# Patient Record
Sex: Female | Born: 1970 | Race: Black or African American | Hispanic: No | Marital: Single | Smoking: Current every day smoker
Health system: Southern US, Community
[De-identification: ages and names within clinical notes are randomized; demographics above are authoritative.]

## PROBLEM LIST (undated history)

## (undated) DIAGNOSIS — D649 Anemia, unspecified: Secondary | ICD-10-CM

## (undated) DIAGNOSIS — E119 Type 2 diabetes mellitus without complications: Secondary | ICD-10-CM

## (undated) DIAGNOSIS — E669 Obesity, unspecified: Secondary | ICD-10-CM

## (undated) DIAGNOSIS — I10 Essential (primary) hypertension: Secondary | ICD-10-CM

## (undated) DIAGNOSIS — G4730 Sleep apnea, unspecified: Secondary | ICD-10-CM

## (undated) HISTORY — PX: TUBAL LIGATION: SHX77

## (undated) HISTORY — PX: MULTIPLE TOOTH EXTRACTIONS: SHX2053

---

## 1997-07-27 ENCOUNTER — Other Ambulatory Visit: Admission: RE | Admit: 1997-07-27 | Discharge: 1997-07-27 | Payer: Self-pay | Admitting: Obstetrics

## 1997-08-24 ENCOUNTER — Inpatient Hospital Stay (HOSPITAL_COMMUNITY): Admission: AD | Admit: 1997-08-24 | Discharge: 1997-08-26 | Payer: Self-pay | Admitting: Obstetrics

## 1998-05-24 ENCOUNTER — Emergency Department (HOSPITAL_COMMUNITY): Admission: EM | Admit: 1998-05-24 | Discharge: 1998-05-24 | Payer: Self-pay | Admitting: Emergency Medicine

## 1998-08-23 ENCOUNTER — Other Ambulatory Visit: Admission: RE | Admit: 1998-08-23 | Discharge: 1998-08-23 | Payer: Self-pay | Admitting: Obstetrics

## 1998-10-04 ENCOUNTER — Inpatient Hospital Stay (HOSPITAL_COMMUNITY): Admission: AD | Admit: 1998-10-04 | Discharge: 1998-10-04 | Payer: Self-pay | Admitting: Obstetrics

## 1999-04-02 ENCOUNTER — Inpatient Hospital Stay (HOSPITAL_COMMUNITY): Admission: AD | Admit: 1999-04-02 | Discharge: 1999-04-02 | Payer: Self-pay | Admitting: Obstetrics

## 1999-04-10 ENCOUNTER — Inpatient Hospital Stay (HOSPITAL_COMMUNITY): Admission: AD | Admit: 1999-04-10 | Discharge: 1999-04-14 | Payer: Self-pay | Admitting: Obstetrics

## 1999-04-28 ENCOUNTER — Observation Stay (HOSPITAL_COMMUNITY): Admission: AD | Admit: 1999-04-28 | Discharge: 1999-04-29 | Payer: Self-pay | Admitting: Obstetrics

## 2002-01-16 ENCOUNTER — Encounter: Payer: Self-pay | Admitting: Emergency Medicine

## 2002-01-16 ENCOUNTER — Emergency Department (HOSPITAL_COMMUNITY): Admission: EM | Admit: 2002-01-16 | Discharge: 2002-01-16 | Payer: Self-pay | Admitting: Emergency Medicine

## 2003-01-20 ENCOUNTER — Emergency Department (HOSPITAL_COMMUNITY): Admission: EM | Admit: 2003-01-20 | Discharge: 2003-01-20 | Payer: Self-pay

## 2003-04-29 ENCOUNTER — Encounter: Admission: RE | Admit: 2003-04-29 | Discharge: 2003-04-29 | Payer: Self-pay | Admitting: Family Medicine

## 2004-06-19 ENCOUNTER — Emergency Department (HOSPITAL_COMMUNITY): Admission: EM | Admit: 2004-06-19 | Discharge: 2004-06-19 | Payer: Self-pay | Admitting: Emergency Medicine

## 2005-05-31 ENCOUNTER — Inpatient Hospital Stay (HOSPITAL_COMMUNITY): Admission: AD | Admit: 2005-05-31 | Discharge: 2005-05-31 | Payer: Self-pay | Admitting: Family Medicine

## 2005-10-13 ENCOUNTER — Emergency Department (HOSPITAL_COMMUNITY): Admission: EM | Admit: 2005-10-13 | Discharge: 2005-10-13 | Payer: Self-pay | Admitting: Family Medicine

## 2007-10-15 ENCOUNTER — Inpatient Hospital Stay (HOSPITAL_COMMUNITY): Admission: AD | Admit: 2007-10-15 | Discharge: 2007-10-15 | Payer: Self-pay | Admitting: Obstetrics & Gynecology

## 2007-10-22 ENCOUNTER — Ambulatory Visit: Payer: Self-pay | Admitting: Obstetrics & Gynecology

## 2007-11-12 ENCOUNTER — Ambulatory Visit: Payer: Self-pay | Admitting: Obstetrics & Gynecology

## 2008-09-05 ENCOUNTER — Emergency Department (HOSPITAL_COMMUNITY): Admission: EM | Admit: 2008-09-05 | Discharge: 2008-09-05 | Payer: Self-pay | Admitting: Family Medicine

## 2008-09-20 ENCOUNTER — Emergency Department (HOSPITAL_COMMUNITY): Admission: EM | Admit: 2008-09-20 | Discharge: 2008-09-20 | Payer: Self-pay | Admitting: Emergency Medicine

## 2008-11-25 ENCOUNTER — Emergency Department (HOSPITAL_COMMUNITY): Admission: EM | Admit: 2008-11-25 | Discharge: 2008-11-25 | Payer: Self-pay | Admitting: Emergency Medicine

## 2008-12-17 ENCOUNTER — Emergency Department (HOSPITAL_COMMUNITY): Admission: EM | Admit: 2008-12-17 | Discharge: 2008-12-17 | Payer: Self-pay | Admitting: Emergency Medicine

## 2010-04-01 ENCOUNTER — Encounter: Payer: Self-pay | Admitting: Family Medicine

## 2010-06-01 ENCOUNTER — Ambulatory Visit (INDEPENDENT_AMBULATORY_CARE_PROVIDER_SITE_OTHER): Payer: Self-pay

## 2010-06-01 ENCOUNTER — Inpatient Hospital Stay (INDEPENDENT_AMBULATORY_CARE_PROVIDER_SITE_OTHER)
Admission: RE | Admit: 2010-06-01 | Discharge: 2010-06-01 | Disposition: A | Discharge status: Home / Self Care | Payer: Self-pay | Source: Ambulatory Visit | Attending: Family Medicine | Admitting: Family Medicine

## 2010-06-01 DIAGNOSIS — IMO0002 Reserved for concepts with insufficient information to code with codable children: Secondary | ICD-10-CM

## 2010-06-01 DIAGNOSIS — S93409A Sprain of unspecified ligament of unspecified ankle, initial encounter: Secondary | ICD-10-CM

## 2010-06-16 LAB — POCT I-STAT, CHEM 8
BUN: 8 mg/dL (ref 6–23)
Creatinine, Ser: 0.8 mg/dL (ref 0.4–1.2)
HCT: 45 % (ref 36.0–46.0)
Hemoglobin: 15.3 g/dL — ABNORMAL HIGH (ref 12.0–15.0)
Sodium: 139 mEq/L (ref 135–145)

## 2010-06-16 LAB — POCT PREGNANCY, URINE: Preg Test, Ur: NEGATIVE

## 2010-06-18 LAB — URINALYSIS, ROUTINE W REFLEX MICROSCOPIC
Bilirubin Urine: NEGATIVE
Glucose, UA: NEGATIVE mg/dL
Ketones, ur: NEGATIVE mg/dL
pH: 7 (ref 5.0–8.0)

## 2010-06-18 LAB — URINE MICROSCOPIC-ADD ON

## 2010-06-18 LAB — POCT PREGNANCY, URINE: Preg Test, Ur: NEGATIVE

## 2010-06-19 LAB — CULTURE, ROUTINE-ABSCESS

## 2010-08-20 ENCOUNTER — Ambulatory Visit (INDEPENDENT_AMBULATORY_CARE_PROVIDER_SITE_OTHER): Payer: Self-pay

## 2010-08-20 ENCOUNTER — Inpatient Hospital Stay (INDEPENDENT_AMBULATORY_CARE_PROVIDER_SITE_OTHER)
Admission: RE | Admit: 2010-08-20 | Discharge: 2010-08-20 | Disposition: A | Discharge status: Home / Self Care | Payer: Self-pay | Source: Ambulatory Visit | Attending: Family Medicine | Admitting: Family Medicine

## 2010-08-20 DIAGNOSIS — R071 Chest pain on breathing: Secondary | ICD-10-CM

## 2010-08-20 DIAGNOSIS — M79609 Pain in unspecified limb: Secondary | ICD-10-CM

## 2010-12-08 LAB — CBC
HCT: 25.4 — ABNORMAL LOW
RBC: 3.73 — ABNORMAL LOW
WBC: 5.8

## 2010-12-08 LAB — SAMPLE TO BLOOD BANK

## 2010-12-08 LAB — WET PREP, GENITAL: Trich, Wet Prep: NONE SEEN

## 2010-12-08 LAB — GC/CHLAMYDIA PROBE AMP, GENITAL: GC Probe Amp, Genital: NEGATIVE

## 2012-03-20 ENCOUNTER — Emergency Department (HOSPITAL_COMMUNITY)
Admission: EM | Admit: 2012-03-20 | Discharge: 2012-03-20 | Disposition: A | Discharge status: Home Health | Payer: Self-pay | Attending: Emergency Medicine | Admitting: Emergency Medicine

## 2012-03-20 ENCOUNTER — Encounter (HOSPITAL_COMMUNITY): Payer: Self-pay | Admitting: *Deleted

## 2012-03-20 DIAGNOSIS — X500XXA Overexertion from strenuous movement or load, initial encounter: Secondary | ICD-10-CM | POA: Insufficient documentation

## 2012-03-20 DIAGNOSIS — H538 Other visual disturbances: Secondary | ICD-10-CM | POA: Insufficient documentation

## 2012-03-20 DIAGNOSIS — I10 Essential (primary) hypertension: Secondary | ICD-10-CM | POA: Insufficient documentation

## 2012-03-20 DIAGNOSIS — IMO0002 Reserved for concepts with insufficient information to code with codable children: Secondary | ICD-10-CM | POA: Insufficient documentation

## 2012-03-20 DIAGNOSIS — F17200 Nicotine dependence, unspecified, uncomplicated: Secondary | ICD-10-CM | POA: Insufficient documentation

## 2012-03-20 DIAGNOSIS — Z79899 Other long term (current) drug therapy: Secondary | ICD-10-CM | POA: Insufficient documentation

## 2012-03-20 DIAGNOSIS — Y93F2 Activity, caregiving, lifting: Secondary | ICD-10-CM | POA: Insufficient documentation

## 2012-03-20 DIAGNOSIS — Y9210 Unspecified residential institution as the place of occurrence of the external cause: Secondary | ICD-10-CM | POA: Insufficient documentation

## 2012-03-20 DIAGNOSIS — S46912A Strain of unspecified muscle, fascia and tendon at shoulder and upper arm level, left arm, initial encounter: Secondary | ICD-10-CM

## 2012-03-20 DIAGNOSIS — R42 Dizziness and giddiness: Secondary | ICD-10-CM | POA: Insufficient documentation

## 2012-03-20 DIAGNOSIS — X503XXA Overexertion from repetitive movements, initial encounter: Secondary | ICD-10-CM | POA: Insufficient documentation

## 2012-03-20 HISTORY — DX: Essential (primary) hypertension: I10

## 2012-03-20 LAB — POCT I-STAT, CHEM 8
Calcium, Ion: 1.12 mmol/L (ref 1.12–1.23)
Chloride: 108 mEq/L (ref 96–112)
HCT: 40 % (ref 36.0–46.0)
Sodium: 143 mEq/L (ref 135–145)

## 2012-03-20 MED ORDER — NAPROXEN 500 MG PO TABS: 500.0000 mg | ORAL_TABLET | Freq: Two times a day (BID) | ORAL | Status: DC

## 2012-03-20 MED ORDER — LISINOPRIL 10 MG PO TABS: 10.0000 mg | ORAL_TABLET | Freq: Every day | ORAL | Status: DC

## 2012-03-20 MED ORDER — LISINOPRIL 10 MG PO TABS
10.0000 mg | ORAL_TABLET | Freq: Once | ORAL | Status: AC
Start: 2012-03-20 — End: 2012-03-20
  Administered 2012-03-20: 10 mg via ORAL
  Filled 2012-03-20: qty 1

## 2012-03-20 MED ORDER — NAPROXEN 250 MG PO TABS
500.0000 mg | ORAL_TABLET | Freq: Once | ORAL | Status: AC
  Administered 2012-03-20: 500 mg via ORAL
  Filled 2012-03-20: qty 2

## 2012-03-20 NOTE — ED Notes (Signed)
Patient states headache and dizziness with blurred vision x 2 days, patient states she feels blood pressure has been running high

## 2012-03-20 NOTE — ED Provider Notes (Signed)
History     CSN: [id number]  Arrival date & time 03/20/12  0317   First MD Initiated Contact with Patient 03/20/12 [phone]      Chief Complaint  Patient presents with  . Headache  . Dizziness    (Consider location/radiation/quality/duration/timing/severity/associated sxs/prior treatment) HPI Comments: The patient is a 42 year old female with a known history of hypertension who is currently unmedicated. She presents with 2 weeks of blurred vision, 2 days of neck pain and left shoulder pain.   She states that 2 weeks ago she noticed that her vision was going to mildly blurry. It is binocular vision, nothing seems to make it better or worse and it is constant. She does not lose her vision, she does not see floaters or bright lights and has no pain in her eyes. She does have a history of requiring corrective lenses but has not had evaluation by an eye specialist in many years.  She also complains of a headache which she stated came on tonight, has mild, diffuse, throbbing.  She also complains of bilateral neck pain and left shoulder pain which is worse when she turns her head from side to side and left upper left arm. 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 She lifts heavy objects and people daily and states that this has been making her pain worse. She denies any acute injuries and denies fevers, chills, nausea, vomiting or any other complaints.  When the patient started having increased left shoulder pain this evening with moving her arm she decided to take her blood pressure and found her systolic blood pressure to be 528 systolic over 110 diastolic.  Patient is a 42 y.o. female presenting with headaches. The history is provided by the patient.  Headache     Past Medical History  Diagnosis Date  . Hypertension     Past Surgical History  Procedure Date  . Cesarean section     No family history on file.  History  Substance Use Topics  . Smoking  status: Current Every Day Smoker  . Smokeless tobacco: Not on file  . Alcohol Use: No    OB History    Grav Para Term Preterm Abortions TAB SAB Ect Mult Living                  Review of Systems  Neurological: Positive for headaches.  All other systems reviewed and are negative.    Allergies  Review of patient's allergies indicates no known allergies.  Home Medications   Current Outpatient Rx  Name  Route  Sig  Dispense  Refill  . LISINOPRIL 10 MG PO TABS   Oral   Take 1 tablet (10 mg total) by mouth daily.   30 tablet   1   . NAPROXEN 500 MG PO TABS   Oral   Take 1 tablet (500 mg total) by mouth 2 (two) times daily with a meal.   30 tablet   0     BP 166/97  Temp 98.1 F (36.7 C) (Oral)  Resp 20  SpO2 98%  LMP 02/24/2012  Physical Exam  Nursing note and vitals reviewed. Constitutional: She appears well-developed and well-nourished. No distress.  HENT:  Head: Normocephalic and atraumatic.  Mouth/Throat: Oropharynx is clear and moist. No oropharyngeal exudate.       Tympanic membranes are clear bilaterally  Eyes: Conjunctivae normal and EOM are normal. Pupils are equal, round, and reactive to light. Right eye exhibits  no discharge. Left eye exhibits no discharge. No scleral icterus.       Pupils are equal round and reactive, her extraocular movements are intact, the optic discs are visualized bilaterally, no obvious papilledema  Neck: Normal range of motion. Neck supple. No JVD present. No thyromegaly present.       Though the patient is resistant to move her neck from side to side during the history, on exam when I look in her ears she is able to fully move her head from side to side to facilitate this exam.  Cardiovascular: Normal rate, regular rhythm, normal heart sounds and intact distal pulses.  Exam reveals no gallop and no friction rub.   No murmur heard. Pulmonary/Chest: Effort normal and breath sounds normal. No respiratory distress. She has no  wheezes. She has no rales.  Abdominal: Soft. Bowel sounds are normal. She exhibits no distension and no mass. There is no tenderness.  Musculoskeletal: Normal range of motion. She exhibits tenderness ( Reproducible tenderness to palpation in the bilateral trapezius and strap muscles of the neck, left shoulder and proximal arm tenderness on the left.). She exhibits no edema.  Lymphadenopathy:    She has no cervical adenopathy.  Neurological: She is alert. Coordination normal.       Gait is normal, speech is clear, cranial nerves III through XII are intact  Skin: Skin is warm and dry. No rash noted. No erythema.  Psychiatric: She has a normal mood and affect. Her behavior is normal.    ED Course  Procedures (including critical care time)   Labs Reviewed  POCT I-STAT, CHEM 8   No results found.   1. Hypertension   2. Strain of shoulder, left       MDM  The patient has hypertension, see vital signs below. She will get visual acuity screening, chemistry testing for potassium and renal function prior to starting therapy with antihypertensives. Will also get EKG for cardiac evaluation in the setting of hypertension and shoulder pain. I suspect that the pain she is having in her shoulders and her neck is related to musculoskeletal type pain and not related to cardiac type pain.  ED ECG REPORT  I personally interpreted this EKG   Date: 03/20/2012   Rate: 84  Rhythm: normal sinus rhythm  QRS Axis: normal  Intervals: normal  ST/T Wave abnormalities: nonspecific T wave changes  Conduction Disutrbances:none  Narrative Interpretation:   Old EKG Reviewed: none available  istat chem normal, Visual acuity normal - pt can f/u with optho as outpt, BP meds started, pt stable for dlc.           Vida Roller, MD 03/20/12 [phone]

## 2012-11-11 ENCOUNTER — Emergency Department (HOSPITAL_COMMUNITY)
Admission: EM | Admit: 2012-11-11 | Discharge: 2012-11-11 | Disposition: A | Discharge status: Home / Self Care | Payer: PRIVATE HEALTH INSURANCE | Attending: Emergency Medicine | Admitting: Emergency Medicine

## 2012-11-11 ENCOUNTER — Encounter (HOSPITAL_COMMUNITY): Payer: Self-pay | Admitting: *Deleted

## 2012-11-11 DIAGNOSIS — M7989 Other specified soft tissue disorders: Secondary | ICD-10-CM | POA: Insufficient documentation

## 2012-11-11 DIAGNOSIS — M79609 Pain in unspecified limb: Secondary | ICD-10-CM

## 2012-11-11 DIAGNOSIS — M79605 Pain in left leg: Secondary | ICD-10-CM

## 2012-11-11 DIAGNOSIS — F17200 Nicotine dependence, unspecified, uncomplicated: Secondary | ICD-10-CM | POA: Insufficient documentation

## 2012-11-11 DIAGNOSIS — I10 Essential (primary) hypertension: Secondary | ICD-10-CM | POA: Insufficient documentation

## 2012-11-11 DIAGNOSIS — E669 Obesity, unspecified: Secondary | ICD-10-CM | POA: Insufficient documentation

## 2012-11-11 DIAGNOSIS — Z79899 Other long term (current) drug therapy: Secondary | ICD-10-CM | POA: Insufficient documentation

## 2012-11-11 HISTORY — DX: Obesity, unspecified: E66.9

## 2012-11-11 MED ORDER — HYDROCODONE-ACETAMINOPHEN 5-325 MG PO TABS: 1.0000 | ORAL_TABLET | ORAL | Status: DC | PRN

## 2012-11-11 MED ORDER — IBUPROFEN 800 MG PO TABS: 800.0000 mg | ORAL_TABLET | Freq: Three times a day (TID) | ORAL | Status: DC | PRN

## 2012-11-11 MED ORDER — IBUPROFEN 400 MG PO TABS
800.0000 mg | ORAL_TABLET | Freq: Once | ORAL | Status: AC
  Administered 2012-11-11: 800 mg via ORAL
  Filled 2012-11-11: qty 2

## 2012-11-11 NOTE — ED Notes (Signed)
Pt reports pain to left leg x 3 weeks, started in left heel and now radiates all the way up her leg. Denies any swelling or redness, denies injury to leg. Ambulatory at triage.

## 2012-11-11 NOTE — ED Provider Notes (Signed)
CSN: [id number]     Arrival date & time 11/11/12  1224 History   First MD Initiated Contact with Patient 11/11/12 1320     Chief Complaint  Patient presents with  . Leg Pain   (Consider location/radiation/quality/duration/timing/severity/associated sxs/prior Treatment) HPI Comments: Patient reports has had left leg pain for the past 3 weeks. States that the pain extends from her left calf into her left thigh. Denies any injury to the area. Does admit to swelling of her lower leg denies fever, chills, chest pain, shortness of breath. Patient denies any immobilization recently, has exogenous estrogen, has never had a blood clot. The patient has a grandmother who had a history of blood clots. Patient is overweight and smokes.   Patient is a 42 y.o. female presenting with leg pain. The history is provided by the patient.  Leg Pain Associated symptoms: no fever     Past Medical History  Diagnosis Date  . Hypertension   . Obesity    Past Surgical History  Procedure Laterality Date  . Cesarean section     History reviewed. No pertinent family history. History  Substance Use Topics  . Smoking status: Current Every Day Smoker  . Smokeless tobacco: Not on file  . Alcohol Use: No   OB History   Grav Para Term Preterm Abortions TAB SAB Ect Mult Living                 Review of Systems  Constitutional: Negative for fever and chills.  Respiratory: Negative for shortness of breath.   Cardiovascular: Positive for leg swelling. Negative for chest pain.  Skin: Negative for color change.  Neurological: Negative for weakness and numbness.    Allergies  Review of patient's allergies indicates no known allergies.  Home Medications   Current Outpatient Rx  Name  Route  Sig  Dispense  Refill  . lisinopril (PRINIVIL,ZESTRIL) 10 MG tablet   Oral   Take 1 tablet (10 mg total) by mouth daily.   30 tablet   1   . OVER THE COUNTER MEDICATION   Oral   Take 1 tablet by mouth daily.           BP 187/100  Pulse 99  Temp(Src) 98.8 F (37.1 C) (Oral)  Resp 20  SpO2 97%  LMP 10/21/2012 Physical Exam  Nursing note and vitals reviewed. Constitutional: She appears well-developed and well-nourished. No distress.  HENT:  Head: Normocephalic and atraumatic.  Neck: Neck supple.  Pulmonary/Chest: Effort normal.  Musculoskeletal:  Left calf swelling and tenderness.  No erythema or warmth.  Distal pulses intact.    Neurological: She is alert.  Skin: She is not diaphoretic.    ED Course  Procedures (including critical care time) Labs Review Labs Reviewed - No data to display Imaging Review No results found.  MDM   1. Left leg swelling   2. Left leg pain    Patient with left calf and left leg pain for 3 weeks without injury. There is no erythema, warmth, fevers to suggest cellulitis. Venous doppler ultrasound ordered given calf tenderness and swelling. Doppler is negative for blood clot.  Pt does have swelling in this leg, possible lymphedema but for unknown reason - pt has never had trauma or surgery that would cause this.  D/C home with pain medication and PCP follow up.  Discussed result, findings, treatment, and follow up  with patient.  Pt given return precautions.  Pt verbalizes understanding and agrees with plan.  Trixie Dredge, PA-C 11/11/12 1610

## 2012-11-11 NOTE — Progress Notes (Signed)
*  PRELIMINARY RESULTS* Vascular Ultrasound Left lower extremity venous duplex has been completed.  Preliminary findings: no obvious evidence of DVT or baker's cyst.   Farrel Demark, RDMS, RVT  11/11/2012, 2:32 PM

## 2012-11-12 NOTE — ED Provider Notes (Signed)
Medical screening examination/treatment/procedure(s) were performed by non-physician practitioner and as supervising physician I was immediately available for consultation/collaboration.    Gilda Crease, MD 11/12/12 1350

## 2013-03-24 ENCOUNTER — Emergency Department (INDEPENDENT_AMBULATORY_CARE_PROVIDER_SITE_OTHER)
Admission: EM | Admit: 2013-03-24 | Discharge: 2013-03-24 | Disposition: A | Discharge status: Home / Self Care | Payer: PRIVATE HEALTH INSURANCE | Source: Home / Self Care | Attending: Family Medicine | Admitting: Family Medicine

## 2013-03-24 ENCOUNTER — Encounter (HOSPITAL_COMMUNITY): Payer: Self-pay | Admitting: Emergency Medicine

## 2013-03-24 DIAGNOSIS — M549 Dorsalgia, unspecified: Secondary | ICD-10-CM

## 2013-03-24 LAB — POCT URINALYSIS DIP (DEVICE)
Bilirubin Urine: NEGATIVE
Glucose, UA: NEGATIVE mg/dL
KETONES UR: NEGATIVE mg/dL
LEUKOCYTES UA: NEGATIVE
Nitrite: NEGATIVE
PH: 6.5 (ref 5.0–8.0)
PROTEIN: 30 mg/dL — AB
Specific Gravity, Urine: 1.025 (ref 1.005–1.030)
Urobilinogen, UA: 0.2 mg/dL (ref 0.0–1.0)

## 2013-03-24 LAB — POCT PREGNANCY, URINE: Preg Test, Ur: NEGATIVE

## 2013-03-24 MED ORDER — KETOROLAC TROMETHAMINE 60 MG/2ML IM SOLN
60.0000 mg | Freq: Once | INTRAMUSCULAR | Status: AC
  Administered 2013-03-24: 60 mg via INTRAMUSCULAR

## 2013-03-24 MED ORDER — KETOROLAC TROMETHAMINE 60 MG/2ML IM SOLN
INTRAMUSCULAR | Status: AC
  Filled 2013-03-24: qty 2

## 2013-03-24 MED ORDER — HYDROCODONE-ACETAMINOPHEN 5-325 MG PO TABS: 1.0000 | ORAL_TABLET | ORAL | Status: DC | PRN

## 2013-03-24 MED ORDER — NAPROXEN 500 MG PO TABS: 500.0000 mg | ORAL_TABLET | Freq: Two times a day (BID) | ORAL | Status: DC

## 2013-03-24 NOTE — ED Provider Notes (Signed)
CSN: [id number]     Arrival date & time 03/24/13  1339 History   First MD Initiated Contact with Patient 03/24/13 1547     Chief Complaint  Patient presents with  . Back Pain   (Consider location/radiation/quality/duration/timing/severity/associated sxs/prior Treatment) HPI Comments: 43 year old female presents for evaluation of back pain. For 3 days, she has had worsening pain in her lower back. She denies any injury, although she does have a very physical job. She has had some mild back pain problems in the past but she says nothing ever this severe. The pain is localized to the back and is nonradiating. Denies any history of immune deficiency or IV drug use. No systemic symptoms.  Patient is a 43 y.o. female presenting with back pain.  Back Pain Associated symptoms: no abdominal pain, no chest pain, no dysuria, no fever and no weakness     Past Medical History  Diagnosis Date  . Hypertension   . Obesity    Past Surgical History  Procedure Laterality Date  . Cesarean section     History reviewed. No pertinent family history. History  Substance Use Topics  . Smoking status: Current Every Day Smoker  . Smokeless tobacco: Not on file  . Alcohol Use: No   OB History   Grav Para Term Preterm Abortions TAB SAB Ect Mult Living                 Review of Systems  Constitutional: Negative for fever and chills.  Eyes: Negative for visual disturbance.  Respiratory: Negative for cough and shortness of breath.   Cardiovascular: Negative for chest pain, palpitations and leg swelling.  Gastrointestinal: Negative for nausea, vomiting and abdominal pain.  Endocrine: Negative for polydipsia and polyuria.  Genitourinary: Negative for dysuria, urgency and frequency.  Musculoskeletal: Positive for back pain. Negative for arthralgias and myalgias.  Skin: Negative for rash.  Neurological: Negative for dizziness, weakness and light-headedness.    Allergies  Review of patient's allergies  indicates no known allergies.  Home Medications   Current Outpatient Rx  Name  Route  Sig  Dispense  Refill  . HYDROcodone-acetaminophen (NORCO/VICODIN) 5-325 MG per tablet   Oral   Take 1 tablet by mouth every 4 (four) hours as needed.   15 tablet   0   . ibuprofen (ADVIL,MOTRIN) 800 MG tablet   Oral   Take 1 tablet (800 mg total) by mouth every 8 (eight) hours as needed for pain.   21 tablet   0   . lisinopril (PRINIVIL,ZESTRIL) 10 MG tablet   Oral   Take 1 tablet (10 mg total) by mouth daily.   30 tablet   1   . naproxen (NAPROSYN) 500 MG tablet   Oral   Take 1 tablet (500 mg total) by mouth 2 (two) times daily.   60 tablet   0   . OVER THE COUNTER MEDICATION   Oral   Take 1 tablet by mouth daily.          BP 159/105  Pulse 86  Temp(Src) 99.3 F (37.4 C) (Oral)  Resp 16  Ht 5\' 11"  (1.803 m)  SpO2 100% Physical Exam  Nursing note and vitals reviewed. Constitutional: She is oriented to person, place, and time. Vital signs are normal. She appears well-developed and well-nourished. No distress.  Morbidly obese  HENT:  Head: Normocephalic and atraumatic.  Pulmonary/Chest: Effort normal. No respiratory distress.  Musculoskeletal:       Lumbar back: She exhibits tenderness and  bony tenderness.       Back:  Neurological: She is alert and oriented to person, place, and time. She has normal strength. Coordination normal.  Skin: Skin is warm and dry. No rash noted. She is not diaphoretic.  Psychiatric: She has a normal mood and affect. Judgment normal.    ED Course  Procedures (including critical care time) Labs Review Labs Reviewed  POCT URINALYSIS DIP (DEVICE) - Abnormal; Notable for the following:    Hgb urine dipstick SMALL (*)    Protein, ur 30 (*)    All other components within normal limits  POCT PREGNANCY, URINE   Imaging Review No results found.    MDM   1. Back pain    Given Toradol here. Discharged on Naprosyn and a short course of  hydrocodone. Followup with primary care   Meds ordered this encounter  Medications  . ketorolac (TORADOL) injection 60 mg    Sig:   . HYDROcodone-acetaminophen (NORCO/VICODIN) 5-325 MG per tablet    Sig: Take 1 tablet by mouth every 4 (four) hours as needed.    Dispense:  15 tablet    Refill:  0    Order Specific Question:  Supervising Provider    Answer:  Linna Hoff [phone]  . naproxen (NAPROSYN) 500 MG tablet    Sig: Take 1 tablet (500 mg total) by mouth 2 (two) times daily.    Dispense:  60 tablet    Refill:  0    Order Specific Question:  Supervising Provider    Answer:  Bradd Canary D [[id number]]       Graylon Good, PA-C 03/24/13 1652

## 2013-03-24 NOTE — Discharge Instructions (Signed)
Back Exercises °Back exercises help treat and prevent back injuries. The goal of back exercises is to increase the strength of your abdominal and back muscles and the flexibility of your back. These exercises should be started when you no longer have back pain. Back exercises include: °· Pelvic Tilt. Lie on your back with your knees bent. Tilt your pelvis until the lower part of your back is against the floor. Hold this position 5 to 10 sec and repeat 5 to 10 times. °· Knee to Chest. Pull first 1 knee up against your chest and hold for 20 to 30 seconds, repeat this with the other knee, and then both knees. This may be done with the other leg straight or bent, whichever feels better. °· Sit-Ups or Curl-Ups. Bend your knees 90 degrees. Start with tilting your pelvis, and do a partial, slow sit-up, lifting your trunk only 30 to 45 degrees off the floor. Take at least 2 to 3 seconds for each sit-up. Do not do sit-ups with your knees out straight. If partial sit-ups are difficult, simply do the above but with only tightening your abdominal muscles and holding it as directed. °· Hip-Lift. Lie on your back with your knees flexed 90 degrees. Push down with your feet and shoulders as you raise your hips a couple inches off the floor; hold for 10 seconds, repeat 5 to 10 times. °· Back arches. Lie on your stomach, propping yourself up on bent elbows. Slowly press on your hands, causing an arch in your low back. Repeat 3 to 5 times. Any initial stiffness and discomfort should lessen with repetition over time. °· Shoulder-Lifts. Lie face down with arms beside your body. Keep hips and torso pressed to floor as you slowly lift your head and shoulders off the floor. °Do not overdo your exercises, especially in the beginning. Exercises may cause you some mild back discomfort which lasts for a few minutes; however, if the pain is more severe, or lasts for more than 15 minutes, do not continue exercises until you see your caregiver.  Improvement with exercise therapy for back problems is slow.  °See your caregivers for assistance with developing a proper back exercise program. °Document Released: 04/05/2004 Document Revised: 05/21/2011 Document Reviewed: 12/28/2010 °Exit[employer]® Patient Information ©2014 [employer]. ° °Back Pain, Adult °Low back pain is very common. About 1 in 5 people have back pain. The cause of low back pain is rarely dangerous. The pain often gets better over time. About half of people with a sudden onset of back pain feel better in just 2 weeks. About 8 in 10 people feel better by 6 weeks.  °CAUSES °Some common causes of back pain include: °· Strain of the muscles or ligaments supporting the spine. °· Wear and tear (degeneration) of the spinal discs. °· Arthritis. °· Direct injury to the back. °DIAGNOSIS °Most of the time, the direct cause of low back pain is not known. However, back pain can be treated effectively even when the exact cause of the pain is unknown. Answering your caregiver's questions about your overall health and symptoms is one of the most accurate ways to make sure the cause of your pain is not dangerous. If your caregiver needs more information, he or she may order lab work or imaging tests (X-rays or MRIs). However, even if imaging tests show changes in your back, this usually does not require surgery. °HOME CARE INSTRUCTIONS °For many people, back pain returns. Since low back pain is rarely dangerous, it is often a condition that people   can learn to manage on their own.  °· Remain active. It is stressful on the back to sit or stand in one place. Do not sit, drive, or stand in one place for more than 30 minutes at a time. Take short walks on level surfaces as soon as pain allows. Try to increase the length of time you walk each day. °· Do not stay in bed. Resting more than 1 or 2 days can delay your recovery. °· Do not avoid exercise or work. Your body is made to move. It is not dangerous to be active,  even though your back may hurt. Your back will likely heal faster if you return to being active before your pain is gone. °· Pay attention to your body when you  bend and lift. Many people have less discomfort when lifting if they bend their knees, keep the load close to their bodies, and avoid twisting. Often, the most comfortable positions are those that put less stress on your recovering back. °· Find a comfortable position to sleep. Use a firm mattress and lie on your side with your knees slightly bent. If you lie on your back, put a pillow under your knees. °· Only take over-the-counter or prescription medicines as directed by your caregiver. Over-the-counter medicines to reduce pain and inflammation are often the most helpful. Your caregiver may prescribe muscle relaxant drugs. These medicines help dull your pain so you can more quickly return to your normal activities and healthy exercise. °· Put ice on the injured area. °· Put ice in a plastic bag. °· Place a towel between your skin and the bag. °· Leave the ice on for 15-20 minutes, 03-04 times a day for the first 2 to 3 days. After that, ice and heat may be alternated to reduce pain and spasms. °· Ask your caregiver about trying back exercises and gentle massage. This may be of some benefit. °· Avoid feeling anxious or stressed. Stress increases muscle tension and can worsen back pain. It is important to recognize when you are anxious or stressed and learn ways to manage it. Exercise is a great option. °SEEK MEDICAL CARE IF: °· You have pain that is not relieved with rest or medicine. °· You have pain that does not improve in 1 week. °· You have new symptoms. °· You are generally not feeling well. °SEEK IMMEDIATE MEDICAL CARE IF:  °· You have pain that radiates from your back into your legs. °· You develop new bowel or bladder control problems. °· You have unusual weakness or numbness in your arms or legs. °· You develop nausea or vomiting. °· You develop  abdominal pain. °· You feel faint. °Document Released: 02/26/2005 Document Revised: 08/28/2011 Document Reviewed: 07/17/2010 °Exit[employer]® Patient Information ©2014 [employer]. ° °

## 2013-03-24 NOTE — ED Notes (Signed)
Pt  Reports  Back  Pain  X  3  Days    denys  Any  Injury  -  She  Also  Reports  A  Spot on her  Heel  Which  She  Has had  For  What  She  descibes  As  A  Long  Time       Her  bp  Is  High  She  Did  Not take  Her  meds   Today

## 2013-03-26 NOTE — ED Provider Notes (Signed)
Medical screening examination/treatment/procedure(s) were performed by resident physician or non-physician practitioner and as supervising physician I was immediately available for consultation/collaboration.   Ishmel Acevedo DOUGLAS MD.   An Lannan D Sakib Noguez, MD 03/26/13 2055 

## 2013-07-27 ENCOUNTER — Emergency Department (HOSPITAL_COMMUNITY)
Admission: EM | Admit: 2013-07-27 | Discharge: 2013-07-27 | Disposition: A | Discharge status: Home / Self Care | Payer: PRIVATE HEALTH INSURANCE | Attending: Emergency Medicine | Admitting: Emergency Medicine

## 2013-07-27 ENCOUNTER — Encounter (HOSPITAL_COMMUNITY): Payer: Self-pay | Admitting: Emergency Medicine

## 2013-07-27 ENCOUNTER — Emergency Department (HOSPITAL_COMMUNITY): Payer: PRIVATE HEALTH INSURANCE

## 2013-07-27 DIAGNOSIS — R51 Headache: Secondary | ICD-10-CM

## 2013-07-27 DIAGNOSIS — R202 Paresthesia of skin: Secondary | ICD-10-CM

## 2013-07-27 DIAGNOSIS — I10 Essential (primary) hypertension: Secondary | ICD-10-CM | POA: Insufficient documentation

## 2013-07-27 DIAGNOSIS — R209 Unspecified disturbances of skin sensation: Secondary | ICD-10-CM | POA: Insufficient documentation

## 2013-07-27 DIAGNOSIS — E669 Obesity, unspecified: Secondary | ICD-10-CM | POA: Insufficient documentation

## 2013-07-27 DIAGNOSIS — R519 Headache, unspecified: Secondary | ICD-10-CM

## 2013-07-27 DIAGNOSIS — F17200 Nicotine dependence, unspecified, uncomplicated: Secondary | ICD-10-CM | POA: Insufficient documentation

## 2013-07-27 LAB — CBC WITH DIFFERENTIAL/PLATELET
BASOS PCT: 0 % (ref 0–1)
Basophils Absolute: 0 10*3/uL (ref 0.0–0.1)
EOS ABS: 0.1 10*3/uL (ref 0.0–0.7)
EOS PCT: 2 % (ref 0–5)
HCT: 38.1 % (ref 36.0–46.0)
HEMOGLOBIN: 12.2 g/dL (ref 12.0–15.0)
LYMPHS ABS: 2.6 10*3/uL (ref 0.7–4.0)
Lymphocytes Relative: 43 % (ref 12–46)
MCH: 27.2 pg (ref 26.0–34.0)
MCHC: 32 g/dL (ref 30.0–36.0)
MCV: 85 fL (ref 78.0–100.0)
MONO ABS: 0.4 10*3/uL (ref 0.1–1.0)
MONOS PCT: 7 % (ref 3–12)
NEUTROS PCT: 48 % (ref 43–77)
Neutro Abs: 2.9 10*3/uL (ref 1.7–7.7)
Platelets: 200 10*3/uL (ref 150–400)
RBC: 4.48 MIL/uL (ref 3.87–5.11)
RDW: 16.4 % — ABNORMAL HIGH (ref 11.5–15.5)
WBC: 6.1 10*3/uL (ref 4.0–10.5)

## 2013-07-27 LAB — URINALYSIS, ROUTINE W REFLEX MICROSCOPIC
BILIRUBIN URINE: NEGATIVE
Glucose, UA: NEGATIVE mg/dL
KETONES UR: NEGATIVE mg/dL
Leukocytes, UA: NEGATIVE
NITRITE: NEGATIVE
Protein, ur: NEGATIVE mg/dL
Specific Gravity, Urine: 1.018 (ref 1.005–1.030)
UROBILINOGEN UA: 0.2 mg/dL (ref 0.0–1.0)
pH: 6 (ref 5.0–8.0)

## 2013-07-27 LAB — BASIC METABOLIC PANEL
BUN: 13 mg/dL (ref 6–23)
CALCIUM: 9.1 mg/dL (ref 8.4–10.5)
CO2: 24 mEq/L (ref 19–32)
CREATININE: 0.85 mg/dL (ref 0.50–1.10)
Chloride: 103 mEq/L (ref 96–112)
GFR, EST NON AFRICAN AMERICAN: 83 mL/min — AB (ref >90)
GLUCOSE: 82 mg/dL (ref 70–99)
POTASSIUM: 4.1 meq/L (ref 3.7–5.3)
Sodium: 139 mEq/L (ref 137–147)

## 2013-07-27 LAB — URINE MICROSCOPIC-ADD ON

## 2013-07-27 MED ORDER — LISINOPRIL 10 MG PO TABS: 10.0000 mg | ORAL_TABLET | Freq: Every day | ORAL | Status: DC

## 2013-07-27 MED ORDER — HYDROCHLOROTHIAZIDE 25 MG PO TABS: 12.5000 mg | ORAL_TABLET | Freq: Every day | ORAL | Status: DC

## 2013-07-27 NOTE — Progress Notes (Signed)
[facility] provided pt with a list of primary care resources and a [facility] [employer] application to help patient establish primary care.  °

## 2013-07-27 NOTE — ED Provider Notes (Signed)
CSN: [id number]     Arrival date & time 07/27/13  1359 History   First MD Initiated Contact with Patient 07/27/13 1535     Chief Complaint  Patient presents with  . Headache  . Numbness     (Consider location/radiation/quality/duration/timing/severity/associated sxs/prior Treatment) Patient is a 43 y.o. female presenting with headaches. The history is provided by the patient. No language interpreter was used.  Headache Pain location:  R temporal Quality:  Dull Radiates to:  Does not radiate Severity currently:  0/10 Pain scale at highest: moderate to severe. Onset quality:  Unable to specify Duration:  4 weeks Timing:  Intermittent Progression:  Waxing and waning Chronicity:  New Relieved by:  Acetaminophen Exacerbated by: coughing, bending over. Associated symptoms: paresthesias (assoc w/ h/a's for past 1 week)   Associated symptoms: no abdominal pain, no back pain, no blurred vision, no congestion, no cough, no diarrhea, no fatigue, no fever, no nausea, no neck pain, no neck stiffness, no numbness, no photophobia, no sore throat, no visual change, no vomiting and no weakness   Risk factors comment:  Morbid obesity, HTN   Past Medical History  Diagnosis Date  . Hypertension   . Obesity    Past Surgical History  Procedure Laterality Date  . Cesarean section     Family History  Problem Relation Age of Onset  . Pneumonia Mother   . Diabetes Father    History  Substance Use Topics  . Smoking status: Current Every Day Smoker -- 0.50 packs/day    Types: Cigarettes  . Smokeless tobacco: Never Used  . Alcohol Use: No   OB History   Grav Para Term Preterm Abortions TAB SAB Ect Mult Living                 Review of Systems  Constitutional: Negative for fever, chills, diaphoresis, activity change, appetite change and fatigue.  HENT: Negative for congestion, facial swelling, rhinorrhea and sore throat.   Eyes: Negative for blurred vision, photophobia and discharge.   Respiratory: Negative for cough, chest tightness and shortness of breath.   Cardiovascular: Negative for chest pain, palpitations and leg swelling.  Gastrointestinal: Negative for nausea, vomiting, abdominal pain and diarrhea.  Endocrine: Negative for polydipsia and polyuria.  Genitourinary: Negative for dysuria, frequency, difficulty urinating and pelvic pain.  Musculoskeletal: Negative for arthralgias, back pain, neck pain and neck stiffness.  Skin: Negative for color change and wound.  Allergic/Immunologic: Negative for immunocompromised state.  Neurological: Positive for headaches and paresthesias (assoc w/ h/a's for past 1 week). Negative for facial asymmetry, weakness and numbness.  Hematological: Does not bruise/bleed easily.  Psychiatric/Behavioral: Negative for confusion and agitation.      Allergies  Review of patient's allergies indicates no known allergies.  Home Medications   Prior to Admission medications   Medication Sig Start Date End Date Taking? Authorizing Provider  acetaminophen (TYLENOL) 500 MG tablet Take 1,000 mg by mouth every 6 (six) hours as needed for mild pain.   Yes Historical Provider, MD  lisinopril (PRINIVIL,ZESTRIL) 10 MG tablet Take 1 tablet (10 mg total) by mouth daily. 03/20/12  Yes Vida RollerBrian D Miller, MD   BP 182/89  Pulse 85  Temp(Src) 98.1 F (36.7 C) (Oral)  Resp 12  SpO2 98%  LMP 07/27/2013 Physical Exam  Constitutional: She is oriented to person, place, and time. She appears well-developed and well-nourished. No distress.  HENT:  Head: Normocephalic and atraumatic.  Mouth/Throat: No oropharyngeal exudate.  Eyes: Pupils are equal, round, and  reactive to light.  Neck: Normal range of motion. Neck supple.  Cardiovascular: Normal rate, regular rhythm and normal heart sounds.  Exam reveals no gallop and no friction rub.   No murmur heard. Pulmonary/Chest: Effort normal and breath sounds normal. No respiratory distress. She has no wheezes. She  has no rales.  Abdominal: Soft. Bowel sounds are normal. She exhibits no distension and no mass. There is no tenderness. There is no rebound and no guarding.  Musculoskeletal: Normal range of motion. She exhibits no edema and no tenderness.  Neurological: She is alert and oriented to person, place, and time. She has normal strength. She displays no tremor. No cranial nerve deficit or sensory deficit. She exhibits normal muscle tone. She displays a negative Romberg sign. Coordination and gait normal. GCS eye subscore is 4. GCS motor subscore is 6.  Skin: Skin is warm and dry.  Psychiatric: She has a normal mood and affect.    ED Course  Procedures (including critical care time) Labs Review Labs Reviewed  CBC WITH DIFFERENTIAL - Abnormal; Notable for the following:    RDW 16.4 (*)    All other components within normal limits  BASIC METABOLIC PANEL - Abnormal; Notable for the following:    GFR calc non Af Amer 83 (*)    All other components within normal limits  URINALYSIS, ROUTINE W REFLEX MICROSCOPIC - Abnormal; Notable for the following:    Hgb urine dipstick SMALL (*)    All other components within normal limits  URINE MICROSCOPIC-ADD ON    Imaging Review Ct Head Wo Contrast  07/27/2013   CLINICAL DATA:  Right-sided headache with dizziness. Intermittent tongue numbness  EXAM: CT HEAD WITHOUT CONTRAST  TECHNIQUE: Contiguous axial images were obtained from the base of the skull through the vertex without intravenous contrast.  COMPARISON:  None.  FINDINGS: Skull and Sinuses:No acute fracture or destructive osseous process. Fairly symmetric lucencies within the bilateral greater wing of the sphenoid is likely arrested air cell development. No associated bone erosion/septal loss. Symmetric appearing skullbase foramina.  Orbits: Divergent gaze without structural abnormality.  Brain: No evidence of acute abnormality, such as acute infarction, hemorrhage, hydrocephalus, or mass lesion/mass  effect.  IMPRESSION: Negative head CT.   Electronically Signed   By: Tiburcio PeaJonathan  Watts M.D.   On: 07/27/2013 16:26     EKG Interpretation   Date/Time:  Monday Jul 27 2013 16:41:53 EDT Ventricular Rate:  85 PR Interval:  160 QRS Duration: 74 QT Interval:  381 QTC Calculation: 453 R Axis:   65 Text Interpretation:  Sinus rhythm Probable left atrial enlargement  Borderline T abnormalities, diffuse leads Baseline wander in lead(s) V1 V6  No significant change since last tracing Confirmed by DOCHERTY  MD, MEGAN  [phone]([id number]) on 07/27/2013 4:46:58 PM      MDM   Final diagnoses:  Headache  Hypertension  Paresthesia    Pt is a 43 y.o. female with Pmhx as above who presents with about 3 weeks of intermittent R temporal headaches, worse w/ cough, bending forward now also assoc with intermittent tongue numbness for past week. She states she took her BP earlier this week when her head hurt and it was >200 systolic. She denies CP, SOB. She has had minimal BLLE edema and urinary frequency.  She has doubled her lisinopril today, but has not established w/ a PCP since being started on lisinopril in the ED about 1 year ago. On PE,  VSS, pt in NAD.  NO focal neuro findings.  Cardiopulm exam benign. Minimal BLLE edema. Suspect h/a related to HTN. Will screen for evidence of end-organ ischemia w/ CT head, EKG, Cr, Ua. No h/a currently.   CT head nml. Cr nml EKG unchanged. Will d/c home w/ additional rx for HCTZ. Pt strongly encouraged to est w/ PCP, start log of her BP. Return precautions given for new or worsening symptoms including CP, SOB, new focal neuro complaints.        Shanna Cisco, MD 07/28/13 [phone]

## 2013-07-27 NOTE — Discharge Instructions (Signed)
Arterial Hypertension °Arterial hypertension (high blood pressure) is a condition of elevated pressure in your blood vessels. Hypertension over a long period of time is a risk factor for strokes, heart attacks, and heart failure. It is also the leading cause of kidney (renal) failure.  °CAUSES  °· In Adults -- Over 90% of all hypertension has no known cause. This is called essential or primary hypertension. In the other 10% of people with hypertension, the increase in blood pressure is caused by another disorder. This is called secondary hypertension. Important causes of secondary hypertension are: °· Heavy alcohol use. °· Obstructive sleep apnea. °· Hyperaldosterosim (Conn's syndrome). °· Steroid use. °· Chronic kidney failure. °· Hyperparathyroidism. °· Medications. °· Renal artery stenosis. °· Pheochromocytoma. °· Cushing's disease. °· Coarctation of the aorta. °· Scleroderma renal crisis. °· Licorice (in excessive amounts). °· Drugs (cocaine, methamphetamine). °Your caregiver can explain any items above that apply to you. °· In Children -- Secondary hypertension is more common and should always be considered. °· Pregnancy -- Few women of childbearing age have high blood pressure. However, up to 10% of them develop hypertension of pregnancy. Generally, this will not harm the woman. It may be a sign of 3 complications of pregnancy: preeclampsia, HELLP syndrome, and eclampsia. Follow up and control with medication is necessary. °SYMPTOMS  °· This condition normally does not produce any noticeable symptoms. It is usually found during a routine exam. °· Malignant hypertension is a late problem of high blood pressure. It may have the following symptoms: °· Headaches. °· Blurred vision. °· End-organ damage (this means your kidneys, heart, lungs, and other organs are being damaged). °· Stressful situations can increase the blood pressure. If a person with normal blood pressure has their blood pressure go up while being  seen by their caregiver, this is often termed "white coat hypertension." Its importance is not known. It may be related with eventually developing hypertension or complications of hypertension. °· Hypertension is often confused with mental tension, stress, and anxiety. °DIAGNOSIS  °The diagnosis is made by 3 separate blood pressure measurements. They are taken at least 1 week apart from each other. If there is organ damage from hypertension, the diagnosis may be made without repeat measurements. °Hypertension is usually identified by having blood pressure readings: °· Above 140/90 mmHg measured in both arms, at 3 separate times, over a couple weeks. °· Over 130/80 mmHg should be considered a risk factor and may require treatment in patients with diabetes. °Blood pressure readings over 120/80 mmHg are called "pre-hypertension" even in non-diabetic patients. °To get a true blood pressure measurement, use the following guidelines. Be aware of the factors that can alter blood pressure readings. °· Take measurements at least 1 hour after caffeine. °· Take measurements 30 minutes after smoking and without any stress. This is another reason to quit smoking  it raises your blood pressure. °· Use a proper cuff size. Ask your caregiver if you are not sure about your cuff size. °· Most home blood pressure cuffs are automatic. They will measure systolic and diastolic pressures. The systolic pressure is the pressure reading at the start of sounds. Diastolic pressure is the pressure at which the sounds disappear. If you are elderly, measure pressures in multiple postures. Try sitting, lying or standing. °· Sit at rest for a minimum of 5 minutes before taking measurements. °· You should not be on any medications like decongestants. These are found in many cold medications. °· Record your blood pressure readings and review   them with your caregiver. °If you have hypertension: °· Your caregiver may do tests to be sure you do not have  secondary hypertension (see "causes" above). °· Your caregiver may also look for signs of metabolic syndrome. This is also called Syndrome X or Insulin Resistance Syndrome. You may have this syndrome if you have type 2 diabetes, abdominal obesity, and abnormal blood lipids in addition to hypertension. °· Your caregiver will take your medical and family history and perform a physical exam. °· Diagnostic tests may include blood tests (for glucose, cholesterol, potassium, and kidney function), a urinalysis, or an EKG. Other tests may also be necessary depending on your condition. °PREVENTION  °There are important lifestyle issues that you can adopt to reduce your chance of developing hypertension: °· Maintain a normal weight. °· Limit the amount of salt (sodium) in your diet. °· Exercise often. °· Limit alcohol intake. °· Get enough potassium in your diet. Discuss specific advice with your caregiver. °· Follow a DASH diet (dietary approaches to stop hypertension). This diet is rich in fruits, vegetables, and low-fat dairy products, and avoids certain fats. °PROGNOSIS  °Essential hypertension cannot be cured. Lifestyle changes and medical treatment can lower blood pressure and reduce complications. The prognosis of secondary hypertension depends on the underlying cause. Many people whose hypertension is controlled with medicine or lifestyle changes can live a normal, healthy life.  °RISKS AND COMPLICATIONS  °While high blood pressure alone is not an illness, it often requires treatment due to its short- and long-term effects on many organs. Hypertension increases your risk for: °· CVAs or strokes (cerebrovascular accident). °· Heart failure due to chronically high blood pressure (hypertensive cardiomyopathy). °· Heart attack (myocardial infarction). °· Damage to the retina (hypertensive retinopathy). °· Kidney failure (hypertensive nephropathy). °Your caregiver can explain list items above that apply to you. Treatment  of hypertension can significantly reduce the risk of complications. °TREATMENT  °· For overweight patients, weight loss and regular exercise are recommended. Physical fitness lowers blood pressure. °· Mild hypertension is usually treated with diet and exercise. A diet rich in fruits and vegetables, fat-free dairy products, and foods low in fat and salt (sodium) can help lower blood pressure. Decreasing salt intake decreases blood pressure in a 1/3 of people. °· Stop smoking if you are a smoker. °The steps above are highly effective in reducing blood pressure. While these actions are easy to suggest, they are difficult to achieve. Most patients with moderate or severe hypertension end up requiring medications to bring their blood pressure down to a normal level. There are several classes of medications for treatment. Blood pressure pills (antihypertensives) will lower blood pressure by their different actions. Lowering the blood pressure by 10 mmHg may decrease the risk of complications by as much as 25%. °The goal of treatment is effective blood pressure control. This will reduce your risk for complications. Your caregiver will help you determine the best treatment for you according to your lifestyle. What is excellent treatment for one person, may not be for you. °HOME CARE INSTRUCTIONS  °· Do not smoke. °· Follow the lifestyle changes outlined in the "Prevention" section. °· If you are on medications, follow the directions carefully. Blood pressure medications must be taken as prescribed. Skipping doses reduces their benefit. It also puts you at risk for problems. °· Follow up with your caregiver, as directed. °· If you are asked to monitor your blood pressure at home, follow the guidelines in the "Diagnosis" section above. °SEEK MEDICAL CARE   IF:  °· You think you are having medication side effects. °· You have recurrent headaches or lightheadedness. °· You have swelling in your ankles. °· You have trouble with  your vision. °SEEK IMMEDIATE MEDICAL CARE IF:  °· You have sudden onset of chest pain or pressure, difficulty breathing, or other symptoms of a heart attack. °· You have a severe headache. °· You have symptoms of a stroke (such as sudden weakness, difficulty speaking, difficulty walking). °MAKE SURE YOU:  °· Understand these instructions. °· Will watch your condition. °· Will get help right away if you are not doing well or get worse. °Document Released: 02/26/2005 Document Revised: 05/21/2011 Document Reviewed: 09/26/2006 °ExitCare® Patient Information ©2014 [employer]. ° °Headaches, Frequently Asked Questions °MIGRAINE HEADACHES °Q: What is migraine? What causes it? How can I treat it? °A: Generally, migraine headaches begin as a dull ache. Then they develop into a constant, throbbing, and pulsating pain. You may experience pain at the temples. You may experience pain at the front or back of one or both sides of the head. The pain is usually accompanied by a combination of: °· Nausea. °· Vomiting. °· Sensitivity to light and noise. °Some people (about 15%) experience an aura (see below) before an attack. The cause of migraine is believed to be chemical reactions in the brain. Treatment for migraine may include over-the-counter or prescription medications. It may also include self-help techniques. These include relaxation training and biofeedback.  °Q: What is an aura? °A: About 15% of people with migraine get an "aura". This is a sign of neurological symptoms that occur before a migraine headache. You may see wavy or jagged lines, dots, or flashing lights. You might experience tunnel vision or blind spots in one or both eyes. The aura can include visual or auditory hallucinations (something imagined). It may include disruptions in smell (such as strange odors), taste or touch. Other symptoms include: °· Numbness. °· A "pins and needles" sensation. °· Difficulty in recalling or speaking the correct word. °These  neurological events may last as long as 60 minutes. These symptoms will fade as the headache begins. °Q: What is a trigger? °A: Certain physical or environmental factors can lead to or "trigger" a migraine. These include: °· Foods. °· Hormonal changes. °· Weather. °· Stress. °It is important to remember that triggers are different for everyone. To help prevent migraine attacks, you need to figure out which triggers affect you. Keep a headache diary. This is a good way to track triggers. The diary will help you talk to your healthcare professional about your condition. °Q: Does weather affect migraines? °A: Bright sunshine, hot, humid conditions, and drastic changes in barometric pressure may lead to, or "trigger," a migraine attack in some people. But studies have shown that weather does not act as a trigger for everyone with migraines. °Q: What is the link between migraine and hormones? °A: Hormones start and regulate many of your body's functions. Hormones keep your body in balance within a constantly changing environment. The levels of hormones in your body are unbalanced at times. Examples are during menstruation, pregnancy, or menopause. That can lead to a migraine attack. In fact, about three quarters of all women with migraine report that their attacks are related to the menstrual cycle.  °Q: Is there an increased risk of stroke for migraine sufferers? °A: The likelihood of a migraine attack causing a stroke is very remote. That is not to say that migraine sufferers cannot have a stroke associated   with their migraines. In persons under age 40, the most common associated factor for stroke is migraine headache. But over the course of a person's normal life span, the occurrence of migraine headache may actually be associated with a reduced risk of dying from cerebrovascular disease due to stroke.  °Q: What are acute medications for migraine? °A: Acute medications are used to treat the pain of the headache after  it has started. Examples over-the-counter medications, NSAIDs, ergots, and triptans.  °Q: What are the triptans? °A: Triptans are the newest class of abortive medications. They are specifically targeted to treat migraine. Triptans are vasoconstrictors. They moderate some chemical reactions in the brain. The triptans work on receptors in your brain. Triptans help to restore the balance of a neurotransmitter called serotonin. Fluctuations in levels of serotonin are thought to be a main cause of migraine.  °Q: Are over-the-counter medications for migraine effective? °A: Over-the-counter, or "OTC," medications may be effective in relieving mild to moderate pain and associated symptoms of migraine. But you should see your caregiver before beginning any treatment regimen for migraine.  °Q: What are preventive medications for migraine? °A: Preventive medications for migraine are sometimes referred to as "prophylactic" treatments. They are used to reduce the frequency, severity, and length of migraine attacks. Examples of preventive medications include antiepileptic medications, antidepressants, beta-blockers, calcium channel blockers, and NSAIDs (nonsteroidal anti-inflammatory drugs). °Q: Why are anticonvulsants used to treat migraine? °A: During the past few years, there has been an increased interest in antiepileptic drugs for the prevention of migraine. They are sometimes referred to as "anticonvulsants". Both epilepsy and migraine may be caused by similar reactions in the brain.  °Q: Why are antidepressants used to treat migraine? °A: Antidepressants are typically used to treat people with depression. They may reduce migraine frequency by regulating chemical levels, such as serotonin, in the brain.  °Q: What alternative therapies are used to treat migraine? °A: The term "alternative therapies" is often used to describe treatments considered outside the scope of conventional Western medicine. Examples of alternative  therapy include acupuncture, acupressure, and yoga. Another common alternative treatment is herbal therapy. Some herbs are believed to relieve headache pain. Always discuss alternative therapies with your caregiver before proceeding. Some herbal products contain arsenic and other toxins. °TENSION HEADACHES °Q: What is a tension-type headache? What causes it? How can I treat it? °A: Tension-type headaches occur randomly. They are often the result of temporary stress, anxiety, fatigue, or anger. Symptoms include soreness in your temples, a tightening band-like sensation around your head (a "vice-like" ache). Symptoms can also include a pulling feeling, pressure sensations, and contracting head and neck muscles. The headache begins in your forehead, temples, or the back of your head and neck. Treatment for tension-type headache may include over-the-counter or prescription medications. Treatment may also include self-help techniques such as relaxation training and biofeedback. °CLUSTER HEADACHES °Q: What is a cluster headache? What causes it? How can I treat it? °A: Cluster headache gets its name because the attacks come in groups. The pain arrives with little, if any, warning. It is usually on one side of the head. A tearing or bloodshot eye and a runny nose on the same side of the headache may also accompany the pain. Cluster headaches are believed to be caused by chemical reactions in the brain. They have been described as the most severe and intense of any headache type. Treatment for cluster headache includes prescription medication and oxygen. °SINUS HEADACHES °Q: What is a sinus   headache? What causes it? How can I treat it? °A: When a cavity in the bones of the face and skull (a sinus) becomes inflamed, the inflammation will cause localized pain. This condition is usually the result of an allergic reaction, a tumor, or an infection. If your headache is caused by a sinus blockage, such as an infection, you will  probably have a fever. An x-ray will confirm a sinus blockage. Your caregiver's treatment might include antibiotics for the infection, as well as antihistamines or decongestants.  °REBOUND HEADACHES °Q: What is a rebound headache? What causes it? How can I treat it? °A: A pattern of taking acute headache medications too often can lead to a condition known as "rebound headache." A pattern of taking too much headache medication includes taking it more than 2 days per week or in excessive amounts. That means more than the label or a caregiver advises. With rebound headaches, your medications not only stop relieving pain, they actually begin to cause headaches. Doctors treat rebound headache by tapering the medication that is being overused. Sometimes your caregiver will gradually substitute a different type of treatment or medication. Stopping may be a challenge. Regularly overusing a medication increases the potential for serious side effects. Consult a caregiver if you regularly use headache medications more than 2 days per week or more than the label advises. °ADDITIONAL QUESTIONS AND ANSWERS °Q: What is biofeedback? °A: Biofeedback is a self-help treatment. Biofeedback uses special equipment to monitor your body's involuntary physical responses. Biofeedback monitors: °· Breathing. °· Pulse. °· Heart rate. °· Temperature. °· Muscle tension. °· Brain activity. °Biofeedback helps you refine and perfect your relaxation exercises. You learn to control the physical responses that are related to stress. Once the technique has been mastered, you do not need the equipment any more. °Q: Are headaches hereditary? °A: Four out of five (80%) of people that suffer report a family history of migraine. Scientists are not sure if this is genetic or a family predisposition. Despite the uncertainty, a child has a 50% chance of having migraine if one parent suffers. The child has a 75% chance if both parents suffer.  °Q: Can children  get headaches? °A: By the time they reach high school, most young people have experienced some type of headache. Many safe and effective approaches or medications can prevent a headache from occurring or stop it after it has begun.  °Q: What type of doctor should I see to diagnose and treat my headache? °A: Start with your primary caregiver. Discuss his or her experience and approach to headaches. Discuss methods of classification, diagnosis, and treatment. Your caregiver may decide to recommend you to a headache specialist, depending upon your symptoms or other physical conditions. Having diabetes, allergies, etc., may require a more comprehensive and inclusive approach to your headache. The National Head[employer] Foundation will provide, upon request, a list of NHF physician members in your state. °Document Released: 05/19/2003 Document Revised: 05/21/2011 Document Reviewed: 10/27/2007 °ExitCare® Patient Information ©2014 [employer]. °  Emergency Department Resource Guide °1) Find a Doctor and Pay Out of Pocket °Although you won't have to find out who is covered by your insurance plan, it is a good idea to ask around and get recommendations. You will then need to call the office and see if the doctor you have chosen will accept you as a new patient and what types of options they offer for patients who are self-pay. Some doctors offer discounts or will set up payment plans   for their patients who do not have insurance, but you will need to ask so you aren't surprised when you get to your appointment. ° °2) Contact Your Local Health Department °Not all health departments have doctors that can see patients for sick visits, but many do, so it is worth a call to see if yours does. If you don't know where your local health department is, you can check in your phone book. The CDC also has a tool to help you locate your state's health department, and many state websites also have listings of all of their local health  departments. ° °3) Find a Walk-in Clinic °If your illness is not likely to be very severe or complicated, you may want to try a walk in clinic. These are popping up all over the country in pharmacies, drugstores, and shopping centers. They're usually staffed by nurse practitioners or physician assistants that have been trained to treat common illnesses and complaints. They're usually fairly quick and inexpensive. However, if you have serious medical issues or chronic medical problems, these are probably not your best option. ° °No Primary Care Doctor: °- Call [facility] at  832-8000 - they can help you locate a primary care doctor that  accepts your insurance, provides certain services, etc. °- Physician Referral Service- 1-800-533-3463 ° °Chronic Pain Problems: °Organization         Address  Phone   Notes  °[facility]  ([phone] Patients need to be referred by their primary care doctor.  ° °Medication Assistance: °Organization         Address  Phone   Notes  °[place] [facility] [address] °[place], [place] [address] [phone]) [phone] --Must be a resident of [place] °-- Must have NO insurance coverage whatsoever (no Medicaid/ Medicare, etc.) °-- The pt. MUST have a primary care doctor that directs their care regularly and follows them in the community °  °[facility]  (866) 331-1348   °[employer]  (888) 892-1162   ° °Agencies that provide inexpensive medical care: °Organization         Address  Phone   Notes  °[facility]  ([phone]) [phone]   °[facility]    ([phone]) [phone]   °[facility] [address] °[place], [place] [address] [phone]) [phone]   °[facility] [address], °[place] ([phone]) [phone]   °[facility]    ([phone]) 373-0678   °[facility]    ([phone]) [phone]   °[facility] ° [address], [place] Phone:  ([phone])  [phone], Fax:  ([phone] Hours of Operation:  9 am - 6 pm, M-F.  Also accepts Medicaid/Medicare and self-pay.  °[facility] ° [address], [place] Phone: ([phone]) [phone], Fax: [phone]. Hours of Operation:  8:30 am - 5:30 pm, M-F.  Also accepts Medicaid and self-pay.  °[facility] [place] [address], [place] Phone: ([phone]) [phone]   °[facility] [address], [place], [place] ([phone], Ext. 123 Mondays & Thursdays: 7-9 AM.  First 15 patients are seen on a first come, first serve basis. °  ° °Medicaid-accepting [place] Providers: ° °Organization         Address  Phone   Notes  °[facility] [address], [place] [phone] Also accepts self-pay patients.  °  [facility] [address], [place] ° ([phone]) [phone]   °[facility] [address], [place] ([phone]) [phone]   °[facility] [address] [place] [address], [place] ([phone]) [phone]   °[facility] [address], [place]  ° ([phone]) [phone] Only accepts [place] Access Medicaid patients after they have their name applied to their card.  ° °Self-Pay (no insurance) in [place]: ° °Organization         Address  Phone   Notes  °Sickle Cell Patients, [facility] [address], [place] ([phone]) [phone]   °[facility] [address], [place] ([phone]) [phone]   °[facility] ° [address], [place] [phone]) [phone]   °[facility]/Dr. Osei-Bonsu ° [address] [place] [address], [place] or [address], [place] [phone] Phone number for both [place] and [place] locations is the same.  °[facility] [address], [place] ([phone]) [phone]   °[facility] [address] [place] [address], [place] or [address] ([phone]  [phone] °([phone]) [phone]   °[facility] [address], [place] ([phone]) [phone], phone; ([phone]) [phone], fax Sees patients 1st and 3rd Saturday of every month.  Must not qualify for public or private insurance (i.e. Medicaid, Medicare, [place] Health Choice, Veterans' Benefits) • Household income should be no more than 200% of the poverty level •The clinic cannot treat you if you are pregnant or think you are pregnant • Sexually transmitted diseases are not treated at the clinic.  ° °Dental Care: °Organization         Address  Phone  Notes  °[facility] [address], [place] (336) [phone] Accepts children up to age 21 who are enrolled in Medicaid or [place] Health Choice; pregnant women with a Medicaid card; and children who have applied for Medicaid or [place] Health Choice, but were declined, whose parents can pay a reduced fee at time of service.  °[facility] [place]  [address], [place] (336) 641-7733 Accepts children up to age 21 who are enrolled in Medicaid or [place] Health Choice; pregnant women with a Medicaid card; and children who have applied for Medicaid or  Health Choice, but were declined, whose parents can pay a reduced fee at time of service.  °[facility] ° [address],  (336) 641-4533 Patients are seen by appointment only. Walk-ins are not accepted. [facility] will see patients 18 years of age and older. °Monday - Tuesday (8am-5pm) °Most Wednesdays (8:30-5pm) °$30 per visit, cash only  °[facility] ° [address], [place] (336) [phone] Patients are seen by appointment only. Walk-ins are not accepted. [facility] will see patients 18 years of age and older. °One Wednesday Evening (Monthly: Volunteer Based).  $30 per visit, cash only  °[facility]  (919) [phone] for adults;  Children under age 4, call Graduate Pediatric Dentistry at [phone]. Children aged 4-14, please call [phone] to request a pediatric application. ° Dental services are provided in all areas of dental care including fillings, crowns and bridges, complete and partial dentures, implants, gum treatment, root canals, and extractions. Preventive care is also provided. Treatment is provided to both adults and   children. °Patients are selected via a lottery and there is often a waiting list. °  °[facility] [address], °[place] ° (336) 763-8833 [email or web address].drcivils.[email or web address] °  °[facility] [address], [place], [place] (336)723-1848, Ext. 123 Second and Fourth Thursday of each month, opens at 6:30 AM; Clinic ends at 9 AM.  Patients are seen on a first-come first-served basis, and a limited number are seen during each clinic.  ° °[facility] ° [address], [place], [place] (336) 723-7904   Eligibility Requirements °You must have lived in [place], [place], or [place] counties for at least the last three months. °  You cannot be eligible for state or federal sponsored healthcare insurance, including Veterans Administration, Medicaid, or Medicare. °  You generally cannot be eligible for healthcare insurance through your employer.  °  How to apply: °Eligibility screenings are held every Tuesday and Wednesday afternoon from 1:00 pm until 4:00 pm. You do not need an appointment for the interview!  °[facility] [address], [place], [place] 336-631-2330   °[facility]  336-342-8273   °[facility]  336-703-3100   °[facility]  336-570-6415   ° °Behavioral Health Resources in the Community: °Intensive Outpatient Programs °Organization         Address  Phone  Notes  °[facility] [address], [place], [place] 336-878-6098   ° [facility] [address], [place], [place] 336-832-9800   °[facility] [address], [place], [place] ° 336-882-2125   °[facility] [address],  °[place], [place] 1-800-853-5163 or 336-641-4981   °Substance Abuse Resources °Organization         Address  Phone  Notes  °[facility]  336-882-2125   °[facility]  336-784-9470   °[facility]  336-285-9073   °[facility]  336-845-3988   °Residential & Outpatient Substance Abuse Program  1-800-659-3381   °Psychological Services °Organization         Address  Phone  Notes  °[facility]  336- 832-9600   °[facility]  336- 378-7881   °[facility] [address], [place] 1-800-853-5163 or 336-641-4981   ° °Mobile Crisis Teams °Organization         Address  Phone  Notes  °[facility], Mobile Crisis Care Unit  1-877-626-1772   °Assertive °[facility] ° [address] [place], [place] 336-834-9664   °Sharon DeEsch [address] °[place] 336-554-5454   ° °Self-Help/Support Groups °Organization         Address  Phone             Notes  °[facility] - variety of support groups  336- 373-1402 Call for more information  °Narcotics Anonymous (NA), [facility] [address], °[place]  2 meetings at this location  ° °Residential Treatment Programs °Organization         Address  Phone  Notes  °[facility] [address],    °[place]  1-866-801-8205   °[facility] ° [address], [place], [place] 704-293-8524   °[facility] [address], [place] 336-845-3988 Admissions: 8am-3pm M-F  °[facility] [address]    °[place], [place] 336-841-1104   °[facility]   [address], [place], [place] 336-379-7146   °[facility] [address]  °[place], [place] 336-285-9073   °[facility] - Intensive  Outpatient [address], [place], [place] 336-852-3033   °[facility] [address]  °[place], [place] 1-877-615-2722 or 336-784-9470   °[facility] [address] [place], [place] 336-227-7417 Accepts Medicaid  °[facility] [address]  °[place] 1-800-659-3381 Substance Abuse/Addiction Treatment  ° °[place] [facility] °Organization         Address  Phone  Notes  °[facility]  (888) 581-9988   °Julie Brannon, PhD [address] [place], [place]   (336) 349-5553 or (336) 951-0000   °[facility]   [address] °[place], [place] (336) 349-4454   °[facility] [address], [place], [place] (336) 342-8316 Insurance/Medicaid/sponsorship through [facility]  °[facility] [address]                                    [place], [place] (336) 342-8316 Therapy/tele-psych/case  °[facility] [address]  ° [place], [place] (336) 349-2233    °Dr. Arfeen  (336) 349-4544   °[facility]  [employer] [facility] 1) [address],  °2) [address], [place] °3)  [address], [place] (336) 349-3220 °(336) 342-7768 ° °(336) 342-8140   °[place] [facility] (336) 342-1394 or (336) 342-3537 (After Hours)    ° °   °

## 2013-07-27 NOTE — ED Notes (Signed)
Patient reports that she has pains on the right side of her head that is worse when she coughs. Patient also states that she has  Intermittent numbness of her tongue.Patient states she is not consistent with taking her hypertension meds.

## 2013-12-11 ENCOUNTER — Other Ambulatory Visit: Payer: Self-pay | Admitting: Family

## 2013-12-11 DIAGNOSIS — N938 Other specified abnormal uterine and vaginal bleeding: Secondary | ICD-10-CM

## 2013-12-15 ENCOUNTER — Ambulatory Visit
Admission: RE | Admit: 2013-12-15 | Discharge: 2013-12-15 | Disposition: A | Discharge status: Home / Self Care | Payer: 59 | Source: Ambulatory Visit | Attending: Family | Admitting: Family

## 2013-12-15 DIAGNOSIS — N938 Other specified abnormal uterine and vaginal bleeding: Secondary | ICD-10-CM

## 2013-12-23 ENCOUNTER — Other Ambulatory Visit: Payer: Self-pay | Admitting: Obstetrics & Gynecology

## 2014-02-11 NOTE — Patient Instructions (Addendum)
   Your procedure is scheduled on: Wednesday, Dec 9  [facility] at: 11 AM Pick up the phone at the desk and dial [phone] and inform us of your arrival.  Please call this number if you have any problems the morning of surgery: [phone]  Remember: Do not eat food after midnight: Tuesday Do not drink clear liquids after: 8 AM Wednesday, day of surgery Take these medicines the morning of surgery with a SIP OF WATER:  HCTZ, losartan, pravastatin  Do not wear jewelry, make-up, or FINGER nail polish No metal in your hair or on your body. Do not wear lotions, powders, perfumes.  You may wear deodorant.  Do not bring valuables to the hospital. Contacts, dentures or bridgework may not be worn into surgery.  Patients discharged on the day of surgery will not be allowed to drive home.  Home with [facility] house [phone]

## 2014-02-12 ENCOUNTER — Encounter (HOSPITAL_COMMUNITY)
Admission: RE | Admit: 2014-02-12 | Discharge: 2014-02-12 | Disposition: A | Discharge status: Home / Self Care | Payer: 59 | Source: Ambulatory Visit | Attending: Obstetrics & Gynecology | Admitting: Obstetrics & Gynecology

## 2014-02-12 ENCOUNTER — Encounter (HOSPITAL_COMMUNITY): Payer: Self-pay

## 2014-02-12 DIAGNOSIS — Z01812 Encounter for preprocedural laboratory examination: Secondary | ICD-10-CM | POA: Diagnosis present

## 2014-02-12 HISTORY — DX: Sleep apnea, unspecified: G47.30

## 2014-02-12 HISTORY — DX: Type 2 diabetes mellitus without complications: E11.9

## 2014-02-12 HISTORY — DX: Anemia, unspecified: D64.9

## 2014-02-12 LAB — COMPREHENSIVE METABOLIC PANEL
ALK PHOS: 73 U/L (ref 39–117)
ALT: 11 U/L (ref 0–35)
ANION GAP: 11 (ref 5–15)
AST: 16 U/L (ref 0–37)
Albumin: 3.4 g/dL — ABNORMAL LOW (ref 3.5–5.2)
BUN: 14 mg/dL (ref 6–23)
CO2: 27 mEq/L (ref 19–32)
CREATININE: 0.91 mg/dL (ref 0.50–1.10)
Calcium: 9.3 mg/dL (ref 8.4–10.5)
Chloride: 104 mEq/L (ref 96–112)
GFR calc Af Amer: 89 mL/min — ABNORMAL LOW (ref >90)
GFR calc non Af Amer: 77 mL/min — ABNORMAL LOW (ref >90)
Glucose, Bld: 98 mg/dL (ref 70–99)
POTASSIUM: 4.1 meq/L (ref 3.7–5.3)
Sodium: 142 mEq/L (ref 137–147)
TOTAL PROTEIN: 7.7 g/dL (ref 6.0–8.3)
Total Bilirubin: 0.2 mg/dL — ABNORMAL LOW (ref 0.3–1.2)

## 2014-02-12 LAB — CBC
HEMATOCRIT: 38.4 % (ref 36.0–46.0)
Hemoglobin: 11.8 g/dL — ABNORMAL LOW (ref 12.0–15.0)
MCH: 26.5 pg (ref 26.0–34.0)
MCHC: 30.7 g/dL (ref 30.0–36.0)
MCV: 86.1 fL (ref 78.0–100.0)
Platelets: 250 10*3/uL (ref 150–400)
RBC: 4.46 MIL/uL (ref 3.87–5.11)
RDW: 17.4 % — AB (ref 11.5–15.5)
WBC: 7.2 10*3/uL (ref 4.0–10.5)

## 2014-02-15 NOTE — H&P (Signed)
Brandy Hobbs is an 43 y.o. female. G3P3 who presents for hysteroscopy, D&C, Novasure ablation due to abnormal uterine bleeding. Though her bleeding has recently stopped, prior to the past couple of weeks she had continous bleeding for over 6 months. The bleeding was daily and required 10-12 pads/tampons per day. Passage of small grape-size clots. Previously treated with Depot in 2010, but it did not work to stop the bleeding this time nor did Provera.  An EMB and TVUS were performed that showed no underlying abnormality. Pt reports no change since her prior visit.   Gyn History:  Sexual activity currently sexually active.  Periods : irregular.  Birth control BTL.  Last pap smear date unsure.  Last mammogram date more than two years.  Denies H/O Abnormal pap smear.  Denies H/O STD.  GYN procedures Endometrial Biopsy 10.14.15.        OB History:  Pregnancy # 1 live birth, vaginal delivery, boy.  Pregnancy # 2 live birth, vaginal delivery, girl.  Pregnancy # 3 live birth, C-section, girl.       Past Medical History  Diagnosis Date  . Hypertension   . Obesity   . SVD (spontaneous vaginal delivery)     x 2  . Sleep apnea     Does not use CPAP  . Diabetes mellitus without complication     borderline, no med  . Anemia     Past Surgical History  Procedure Laterality Date  . Cesarean section  2001    x 1 WH  . Multiple tooth extractions    . Tubal ligation      Family History  Problem Relation Age of Onset  . Pneumonia Mother   . Diabetes Father     Social History:  reports that she has been smoking Cigarettes.  She has a 11 pack-year smoking history. She has never used smokeless tobacco. She reports that she does not drink alcohol or use illicit drugs.  Allergies: No Known Allergies   Medications:  Claritin(Loratadine) 10 MG Tablet 1 tablet Once a day,  Pravastatin Sodium 40 MG Tablet 1 tablet Once a day Hydrochlorothiazide 12.5 MG Capsule 1 capsule Once a day,  Taking  Losartan Potassium 100 MG Tablet 1 tablet Once a day,  Provera(Medroxyprogesterone) 10 MG Tablet 2 tablets Once a day  Review of Systems  Constitutional: Negative.   HENT: Negative.   Eyes: Negative.   Respiratory: Negative.   Cardiovascular: Negative.   Gastrointestinal: Negative.   Genitourinary: Negative.   Skin: Negative.     Physical Exam Performed on 12/12 in office Vitals: Wt 397, Wt change -2 lb, Pulse sitting 113, BP sitting 122/81, LMP: 11.11.15.      Examination:  General Examination:  GENERAL APPEARANCE alert, oriented, NAD, pleasant.  SKIN: normal, no rash.  LUNGS: clear to auscultation bilaterally, no wheezes, rhonchi, rales.  HEART: no murmurs, regular rate and rhythm- no tachycardia noted.  ABDOMEN: morbidly obese, soft, non-tender, difficult to assess for masses- none appreciated, uterus does not feel enlarged.  FEMALE GENITOURINARY: normal external genitalia, labia - unremarkable, vagina - pink moist mucosa, no lesions or abnormal discharge, cervix - no discharge or lesions or CMT, adnexa - no masses or tenderness, adnexa - no masses or tenderness- again difficult to assess.  EXTREMITIES: no edema present, no calf tenderness bilaterally.     Labs: 12/23/13: EMB negative 106/15: US: No abnormal findings. Uterus 11.2x5.3x5.2cm-no abnormalities seen. Left ovary unremarkable, right ovary- not well visualized  CBC  Component Value Date/Time   WBC 7.2 02/12/2014 0950   RBC 4.46 02/12/2014 0950   HGB 11.8* 02/12/2014 0950   HCT 38.4 02/12/2014 0950   PLT 250 02/12/2014 0950   MCV 86.1 02/12/2014 0950   MCH 26.5 02/12/2014 0950   MCHC 30.7 02/12/2014 0950   RDW 17.4* 02/12/2014 0950   LYMPHSABS 2.6 07/27/2013 1651   MONOABS 0.4 07/27/2013 1651   EOSABS 0.1 07/27/2013 1651   BASOSABS 0.0 07/27/2013 1651   BMP Latest Ref Rng 02/12/2014 07/27/2013 03/20/2012  Glucose 70 - 99 mg/dL 98 82 95  BUN 6 - 23 mg/dL 14 13 12   Creatinine 0.50 - 1.10 mg/dL  2.950.91 6.210.85 3.080.90  Sodium 137 - 147 mEq/L 142 139 143  Potassium 3.7 - 5.3 mEq/L 4.1 4.1 4.0  Chloride 96 - 112 mEq/L 104 103 108  CO2 19 - 32 mEq/L 27 24 -  Calcium 8.4 - 10.5 mg/dL 9.3 9.1 -    Assessment/Plan: 43yo G3P3 who presents for hysteroscopy, D&C and Novasure endometrial ablation due to abnormal uterine bleeding. -NPO -LR @ 125cc/hr -Ancef 3g IV to OR -SCDs to OR -Pt has been cleared for surgery by PCP -Reviewed risk, benefit and indications including risk of cramping, infection and potential failure of ablation. Questions and concerns addressed, pt aware and wishes to proceed with procedure.   Myna HidalgoZAN, Gwyneth Fernandez, M 02/15/2014, 8:10 PM

## 2014-02-16 MED ORDER — DEXTROSE 5 % IV SOLN
3.0000 g | INTRAVENOUS | Status: AC
  Administered 2014-02-17: 3 g via INTRAVENOUS
  Filled 2014-02-16: qty 3000

## 2014-02-17 ENCOUNTER — Ambulatory Visit (HOSPITAL_COMMUNITY): Payer: 59 | Admitting: Anesthesiology

## 2014-02-17 ENCOUNTER — Ambulatory Visit (HOSPITAL_COMMUNITY)
Admission: RE | Admit: 2014-02-17 | Discharge: 2014-02-17 | Disposition: A | Discharge status: Home / Self Care | Payer: 59 | Source: Ambulatory Visit | Attending: Obstetrics & Gynecology | Admitting: Obstetrics & Gynecology

## 2014-02-17 ENCOUNTER — Encounter (HOSPITAL_COMMUNITY)
Admission: RE | Disposition: A | Discharge status: Home / Self Care | Payer: Self-pay | Source: Ambulatory Visit | Attending: Obstetrics & Gynecology

## 2014-02-17 ENCOUNTER — Encounter (HOSPITAL_COMMUNITY): Payer: Self-pay

## 2014-02-17 DIAGNOSIS — Z6841 Body Mass Index (BMI) 40.0 and over, adult: Secondary | ICD-10-CM | POA: Diagnosis not present

## 2014-02-17 DIAGNOSIS — E119 Type 2 diabetes mellitus without complications: Secondary | ICD-10-CM | POA: Insufficient documentation

## 2014-02-17 DIAGNOSIS — F17210 Nicotine dependence, cigarettes, uncomplicated: Secondary | ICD-10-CM | POA: Insufficient documentation

## 2014-02-17 DIAGNOSIS — I10 Essential (primary) hypertension: Secondary | ICD-10-CM | POA: Insufficient documentation

## 2014-02-17 DIAGNOSIS — N939 Abnormal uterine and vaginal bleeding, unspecified: Secondary | ICD-10-CM | POA: Diagnosis present

## 2014-02-17 DIAGNOSIS — G4730 Sleep apnea, unspecified: Secondary | ICD-10-CM | POA: Insufficient documentation

## 2014-02-17 DIAGNOSIS — D649 Anemia, unspecified: Secondary | ICD-10-CM | POA: Insufficient documentation

## 2014-02-17 DIAGNOSIS — Z833 Family history of diabetes mellitus: Secondary | ICD-10-CM | POA: Diagnosis not present

## 2014-02-17 HISTORY — PX: DILITATION & CURRETTAGE/HYSTROSCOPY WITH NOVASURE ABLATION: SHX5568

## 2014-02-17 LAB — GLUCOSE, CAPILLARY: GLUCOSE-CAPILLARY: 100 mg/dL — AB (ref 70–99)

## 2014-02-17 LAB — PREGNANCY, URINE: Preg Test, Ur: NEGATIVE

## 2014-02-17 SURGERY — DILATATION & CURETTAGE/HYSTEROSCOPY WITH NOVASURE ABLATION
Anesthesia: General | Site: Vagina

## 2014-02-17 MED ORDER — KETOROLAC TROMETHAMINE 30 MG/ML IJ SOLN
INTRAMUSCULAR | Status: DC | PRN
  Administered 2014-02-17: 30 mg via INTRAVENOUS

## 2014-02-17 MED ORDER — MIDAZOLAM HCL 2 MG/2ML IJ SOLN
INTRAMUSCULAR | Status: DC | PRN
  Administered 2014-02-17: 2 mg via INTRAVENOUS

## 2014-02-17 MED ORDER — KETOROLAC TROMETHAMINE 30 MG/ML IJ SOLN
INTRAMUSCULAR | Status: AC
  Filled 2014-02-17: qty 1

## 2014-02-17 MED ORDER — KETOROLAC TROMETHAMINE 30 MG/ML IJ SOLN: 15.0000 mg | Freq: Once | INTRAMUSCULAR | Status: DC | PRN

## 2014-02-17 MED ORDER — FENTANYL CITRATE 0.05 MG/ML IJ SOLN
INTRAMUSCULAR | Status: DC | PRN
  Administered 2014-02-17 (×3): 50 ug via INTRAVENOUS

## 2014-02-17 MED ORDER — LIDOCAINE HCL (CARDIAC) 20 MG/ML IV SOLN
INTRAVENOUS | Status: AC
  Filled 2014-02-17: qty 5

## 2014-02-17 MED ORDER — ONDANSETRON HCL 4 MG/2ML IJ SOLN
INTRAMUSCULAR | Status: DC | PRN
  Administered 2014-02-17: 4 mg via INTRAVENOUS

## 2014-02-17 MED ORDER — IBUPROFEN 600 MG PO TABS: 600.0000 mg | ORAL_TABLET | Freq: Four times a day (QID) | ORAL | Status: DC | PRN

## 2014-02-17 MED ORDER — PROPOFOL 10 MG/ML IV EMUL
INTRAVENOUS | Status: AC
  Filled 2014-02-17: qty 20

## 2014-02-17 MED ORDER — MIDAZOLAM HCL 2 MG/2ML IJ SOLN
INTRAMUSCULAR | Status: AC
  Filled 2014-02-17: qty 2

## 2014-02-17 MED ORDER — FENTANYL CITRATE 0.05 MG/ML IJ SOLN
INTRAMUSCULAR | Status: AC
  Filled 2014-02-17: qty 2

## 2014-02-17 MED ORDER — DEXAMETHASONE SODIUM PHOSPHATE 10 MG/ML IJ SOLN
INTRAMUSCULAR | Status: DC | PRN
  Administered 2014-02-17: 6 mg via INTRAVENOUS

## 2014-02-17 MED ORDER — LACTATED RINGERS IR SOLN
Status: DC | PRN
  Administered 2014-02-17: 3000 mL

## 2014-02-17 MED ORDER — PROPOFOL 10 MG/ML IV BOLUS
INTRAVENOUS | Status: DC | PRN
  Administered 2014-02-17: 300 mg via INTRAVENOUS

## 2014-02-17 MED ORDER — MEPERIDINE HCL 25 MG/ML IJ SOLN: 6.2500 mg | INTRAMUSCULAR | Status: DC | PRN

## 2014-02-17 MED ORDER — DEXAMETHASONE SODIUM PHOSPHATE 10 MG/ML IJ SOLN
INTRAMUSCULAR | Status: AC
  Filled 2014-02-17: qty 1

## 2014-02-17 MED ORDER — ONDANSETRON HCL 4 MG/2ML IJ SOLN
INTRAMUSCULAR | Status: AC
  Filled 2014-02-17: qty 2

## 2014-02-17 MED ORDER — LACTATED RINGERS IV SOLN
INTRAVENOUS | Status: DC
Start: 2014-02-17 — End: 2014-02-17
  Administered 2014-02-17 (×2): via INTRAVENOUS

## 2014-02-17 MED ORDER — LIDOCAINE HCL (CARDIAC) 20 MG/ML IV SOLN
INTRAVENOUS | Status: DC | PRN
  Administered 2014-02-17: 50 mg via INTRAVENOUS

## 2014-02-17 MED ORDER — FENTANYL CITRATE 0.05 MG/ML IJ SOLN
INTRAMUSCULAR | Status: DC
Start: 2014-02-17 — End: 2014-02-17
  Filled 2014-02-17: qty 2

## 2014-02-17 MED ORDER — FENTANYL CITRATE 0.05 MG/ML IJ SOLN
25.0000 ug | INTRAMUSCULAR | Status: DC | PRN
  Administered 2014-02-17: 25 ug via INTRAVENOUS

## 2014-02-17 MED ORDER — SCOPOLAMINE 1 MG/3DAYS TD PT72
MEDICATED_PATCH | TRANSDERMAL | Status: AC
  Administered 2014-02-17: 1.5 mg via TRANSDERMAL
  Filled 2014-02-17: qty 1

## 2014-02-17 MED ORDER — SCOPOLAMINE 1 MG/3DAYS TD PT72
1.0000 | MEDICATED_PATCH | Freq: Once | TRANSDERMAL | Status: DC
  Administered 2014-02-17: 1.5 mg via TRANSDERMAL

## 2014-02-17 MED ORDER — PROMETHAZINE HCL 25 MG/ML IJ SOLN: 6.2500 mg | INTRAMUSCULAR | Status: DC | PRN

## 2014-02-17 SURGICAL SUPPLY — 16 items
ABLATOR ENDOMETRIAL BIPOLAR (ABLATOR) ×1 IMPLANT
CANISTER SUCT 3000ML (MISCELLANEOUS) ×2 IMPLANT
CATH ROBINSON RED A/P 16FR (CATHETERS) ×2 IMPLANT
CLOTH BEACON ORANGE TIMEOUT ST (SAFETY) ×2 IMPLANT
CONTAINER PREFILL 10% NBF 60ML (FORM) ×4 IMPLANT
DILATOR CANAL MILEX (MISCELLANEOUS) IMPLANT
GLOVE BIOGEL PI IND STRL 6.5 (GLOVE) ×2 IMPLANT
GLOVE BIOGEL PI INDICATOR 6.5 (GLOVE) ×2
GLOVE ECLIPSE 6.5 STRL STRAW (GLOVE) ×3 IMPLANT
GOWN STRL REUS W/TWL LRG LVL3 (GOWN DISPOSABLE) ×4 IMPLANT
PACK VAGINAL MINOR WOMEN LF (CUSTOM PROCEDURE TRAY) ×2 IMPLANT
PAD OB MATERNITY 4.3X12.25 (PERSONAL CARE ITEMS) ×2 IMPLANT
TOWEL OR 17X24 6PK STRL BLUE (TOWEL DISPOSABLE) ×4 IMPLANT
TUBING AQUILEX INFLOW (TUBING) ×2 IMPLANT
TUBING AQUILEX OUTFLOW (TUBING) ×3 IMPLANT
WATER STERILE IRR 1000ML POUR (IV SOLUTION) ×2 IMPLANT

## 2014-02-17 NOTE — Anesthesia Procedure Notes (Signed)
Procedure Name: LMA Insertion Date/Time: 02/17/2014 1:18 PM Performed by: Yolonda KidaARVER, Mariellen Blaney L Pre-anesthesia Checklist: Patient identified, Emergency Drugs available, Suction available, Timeout performed and Patient being monitored Patient Re-evaluated:Patient Re-evaluated prior to inductionOxygen Delivery Method: Circle system utilized Preoxygenation: Pre-oxygenation with 100% oxygen Intubation Type: IV induction LMA: LMA inserted LMA Size: 4.0 Number of attempts: 1 Airway Equipment and Method: Stylet and Patient positioned with wedge pillow

## 2014-02-17 NOTE — Op Note (Signed)
Operative Report  PreOp: Abnormal uterine bleeding PostOp: same Procedure:  Hysteroscopy, Dilation and Curettage, Endometrial ablation Surgeon: Dr. Myna HidalgoJennifer Modean Hobbs Anesthesia: General Complications:none EBL: 5cc UOP: 75cc IVF:1300cc   Findings: 9 wk sized anteverted uterus with proliferative endometrium, both ostia visualized  Specimens: 1) Endocervical curettings 2) Endometrial curettings   Procedure: The patient was taken to the operating room where she underwent general anesthesia without difficulty. The patient was placed in a low lithotomy position using Allen stirrups. The patient was examined with the findings as noted above.  She was then prepped and draped in the normal sterile fashion. The bladder was drained using a red rubber urethral catheter. A sterile speculum was inserted into the vagina. A single tooth tenaculum was placed on the anterior lip of the cervix. The uterus was then sounded to 10. The endocervical canal was then serially dilated to 14French using Hank dilators.  The diagnostic hysteroscope was then inserted without difficulty and noted to have the findings as listed above. Using the hysteroscope, the cervical length was noted to be 4. Visualization was achieved using NS as a distending medium. The hysteroscope was removed and sharp curettage was performed. The tissue was sent to pathology.   Attention was then turned to the Novasure. The Novasure was set up according to manufacture instructions. The cavity length was set to 5. The Novasure was inserted, seating test performed and the cavity width was noted to be 4.1. Cavity assessment was performed and passed. The device was then activated for 75sec  at a power level of 113 . Upon completion, the Novasure was removed and the hysteroscope was reinserted. Global ablation was visualized and no uterine perforation was seen. All instrument were then removed. Hemostasis was observed at the cervical site.  The patient was  repositioned to the supine position. The patient tolerated the procedure without any complications and taken to recovery in stable condition.   Brandy HidalgoJennifer Jawon Dipiero, DO [phone] (pager) [phone] (office)

## 2014-02-17 NOTE — Interval H&P Note (Signed)
History and Physical Interval Note:  02/17/2014 12:51 PM  Brandy Hobbs  has presented today for surgery, with the diagnosis of N93.9 Abnormal Uterine Bleeding  The various methods of treatment have been discussed with the patient and family. After consideration of risks, benefits and other options for treatment, the patient has consented to  Procedure(s): DILATATION & CURETTAGE/HYSTEROSCOPY WITH NOVASURE ABLATION (N/A) as a surgical intervention .  The patient's history has been reviewed, patient examined, no change in status, stable for surgery.  I have reviewed the patient's chart and labs.  Questions were answered to the patient's satisfaction.     Myna HidalgoZAN, Ottis Vacha, M

## 2014-02-17 NOTE — Discharge Instructions (Addendum)
HOME INSTRUCTIONS  Please note any unusual or excessive bleeding, pain, swelling. Mild dizziness or drowsiness are normal for about 24 hours after surgery.   Shower when comfortable  Restrictions: No driving for 24 hours or while taking pain medications.  Activity:  No heavy lifting (> 10 lbs), nothing in vagina (no tampons, douching, or intercourse) x 4 weeks; no tub baths for 4 weeks Vaginal spotting is expected but if your bleeding is heavy, period like,  please call the office   Diet:  You may eat whatever you want.  Do not eat large meals.  Eat small frequent meals throughout the day.  Continue to drink a good amount of water at least 6-8 glasses of water per day, hydration is very important for the healing process.  Pain Management: Take Motrin and/or Tylenol as needed.  Always take prescription pain medication with food, it may cause constipation, increase fluids and fiber and you may want to take an over-the-counter stool softener like Colace as needed up to 2x a day.    Alcohol -- Avoid for 24 hours and while taking pain medications.  Nausea: Take sips of ginger ale or soda  Fever -- Call physician if temperature over 101 degrees  Follow up:  If you do not already have a follow up appointment scheduled, please call the office at [phone].  If you experience fever (a temperature greater than 100.4), pain unrelieved by pain medication, shortness of breath, swelling of a single leg, or any other symptoms which are concerning to you please the office immediately.DISCHARGE INSTRUCTIONS: D&C / D&E The following instructions have been prepared to help you care for yourself upon your return home.   Personal hygiene:  Use sanitary pads for vaginal drainage, not tampons.  Shower the day after your procedure.  NO tub baths, pools or Jacuzzis for 2-3 weeks.  Wipe front to back after using the bathroom.  Activity and limitations:  Do NOT drive or operate any equipment for 24  hours. The effects of anesthesia are still present and drowsiness may result.  Do NOT rest in bed all day.  Walking is encouraged.  Walk up and down stairs slowly.  You may resume your normal activity in one to two days or as indicated by your physician.  Sexual activity: NO intercourse for at least 2 weeks after the procedure, or as indicated by your physician.  Diet: Eat a light meal as desired this evening. You may resume your usual diet tomorrow.  Return to work: You may resume your work activities in one to two days or as indicated by your doctor.  What to expect after your surgery: Expect to have vaginal bleeding/discharge for 2-3 days and spotting for up to 10 days. It is not unusual to have soreness for up to 1-2 weeks. You may have a slight burning sensation when you urinate for the first day. Mild cramps may continue for a couple of days. You may have a regular period in 2-6 weeks.  Call your doctor for any of the following:  Excessive vaginal bleeding, saturating and changing one pad every hour.  Inability to urinate 6 hours after discharge from hospital.  Pain not relieved by pain medication.  Fever of 100.4 F or greater.  Unusual vaginal discharge or odor.   Call for an appointment:    Patients signature: ______________________  Nurses signature ________________________  Support person's signature_______________________

## 2014-02-17 NOTE — Anesthesia Preprocedure Evaluation (Addendum)
Anesthesia Evaluation  Patient identified by MRN, date of birth, ID band Patient awake    Reviewed: Allergy & Precautions, H&P , NPO status , Patient's Chart, lab work & pertinent test results, reviewed documented beta blocker date and time   History of Anesthesia Complications Negative for: history of anesthetic complications  Airway Mallampati: II  TM Distance: >3 FB Neck ROM: full    Dental  (+) Poor Dentition Denies loose teeth:   Pulmonary sleep apnea (noncompliant with CPAP machine) , Current Smoker (1/4 ppd),  breath sounds clear to auscultation  Pulmonary exam normal       Cardiovascular Exercise Tolerance: Good hypertension, On Medications Rhythm:regular Rate:Normal     Neuro/Psych negative neurological ROS  negative psych ROS   GI/Hepatic negative GI ROS, Neg liver ROS,   Endo/Other  diabetes (diet-controlled), Type obesity  Renal/GU negative Renal ROS  Female GU complaint     Musculoskeletal   Abdominal   Peds  Hematology  (+) anemia ,   Anesthesia Other Findings   Reproductive/Obstetrics negative OB ROS                            Anesthesia Physical Anesthesia Plan  ASA: III  Anesthesia Plan: General LMA   Post-op Pain Management:    Induction:   Airway Management Planned:   Additional Equipment:   Intra-op Plan:   Post-operative Plan:   Informed Consent: I have reviewed the patients History and Physical, chart, labs and discussed the procedure including the risks, benefits and alternatives for the proposed anesthesia with the patient or authorized representative who has indicated his/her understanding and acceptance.   Dental Advisory Given  Plan Discussed with: CRNA and Surgeon  Anesthesia Plan Comments: (Patient presented option of spinal vs GA and she prefers GA)       Anesthesia Quick Evaluation

## 2014-02-17 NOTE — Transfer of Care (Signed)
Immediate Anesthesia Transfer of Care Note  Patient: Brandy Hobbs  Procedure(s) Performed: Procedure(s): DILATATION & CURETTAGE/HYSTEROSCOPY WITH NOVASURE ABLATION (N/A)  Patient Location: PACU  Anesthesia Type:General  Level of Consciousness: awake, alert  and oriented  Airway & Oxygen Therapy: Patient Spontanous Breathing and Patient connected to nasal cannula oxygen  Post-op Assessment: Report given to PACU RN and Post -op Vital signs reviewed and stable  Post vital signs: Reviewed and stable  Complications: No apparent anesthesia complications

## 2014-02-17 NOTE — Anesthesia Postprocedure Evaluation (Signed)
  Anesthesia Post-op Note  Anesthesia Post Note  Patient: Brandy Hobbs  Procedure(s) Performed: Procedure(s) (LRB): DILATATION & CURETTAGE/HYSTEROSCOPY WITH NOVASURE ABLATION (N/A)  Anesthesia type: General  Patient location: PACU  Post pain: Pain level controlled  Post assessment: Post-op Vital signs reviewed  Last Vitals:  Filed Vitals:   02/17/14 1430  BP: 170/91  Pulse: 82  Temp:   Resp: 13    Post vital signs: Reviewed  Level of consciousness: sedated  Complications: No apparent anesthesia complications

## 2014-02-18 ENCOUNTER — Encounter (HOSPITAL_COMMUNITY): Payer: Self-pay | Admitting: Obstetrics & Gynecology

## 2016-02-04 ENCOUNTER — Ambulatory Visit (INDEPENDENT_AMBULATORY_CARE_PROVIDER_SITE_OTHER): Payer: Managed Care, Other (non HMO) | Admitting: Family Medicine

## 2016-02-04 ENCOUNTER — Ambulatory Visit (INDEPENDENT_AMBULATORY_CARE_PROVIDER_SITE_OTHER): Payer: Managed Care, Other (non HMO)

## 2016-02-04 VITALS — BP 144/96 | HR 120 | Temp 98.6°F | Resp 16 | Ht 70.0 in | Wt 395.0 lb

## 2016-02-04 DIAGNOSIS — I10 Essential (primary) hypertension: Secondary | ICD-10-CM | POA: Diagnosis not present

## 2016-02-04 DIAGNOSIS — M25562 Pain in left knee: Secondary | ICD-10-CM

## 2016-02-04 DIAGNOSIS — Z23 Encounter for immunization: Secondary | ICD-10-CM

## 2016-02-04 DIAGNOSIS — E785 Hyperlipidemia, unspecified: Secondary | ICD-10-CM | POA: Diagnosis not present

## 2016-02-04 DIAGNOSIS — M1990 Unspecified osteoarthritis, unspecified site: Secondary | ICD-10-CM

## 2016-02-04 DIAGNOSIS — Z5181 Encounter for therapeutic drug level monitoring: Secondary | ICD-10-CM

## 2016-02-04 LAB — COMPREHENSIVE METABOLIC PANEL
ALK PHOS: 67 U/L (ref 33–115)
ALT: 9 U/L (ref 6–29)
AST: 13 U/L (ref 10–30)
Albumin: 3.5 g/dL — ABNORMAL LOW (ref 3.6–5.1)
BUN: 10 mg/dL (ref 7–25)
CALCIUM: 8.7 mg/dL (ref 8.6–10.2)
CO2: 28 mmol/L (ref 20–31)
Chloride: 109 mmol/L (ref 98–110)
Creat: 0.88 mg/dL (ref 0.50–1.10)
Glucose, Bld: 84 mg/dL (ref 65–99)
POTASSIUM: 4.5 mmol/L (ref 3.5–5.3)
Sodium: 142 mmol/L (ref 135–146)
TOTAL PROTEIN: 6.8 g/dL (ref 6.1–8.1)
Total Bilirubin: 0.3 mg/dL (ref 0.2–1.2)

## 2016-02-04 LAB — POCT CBC
Granulocyte percent: 49.5 %G (ref 37–80)
HCT, POC: 38.4 % (ref 37.7–47.9)
Hemoglobin: 12.9 g/dL (ref 12.2–16.2)
Lymph, poc: 3 (ref 0.6–3.4)
MCH, POC: 27.7 pg (ref 27–31.2)
MCHC: 33.7 g/dL (ref 31.8–35.4)
MCV: 82.2 fL (ref 80–97)
MID (CBC): 0.3 (ref 0–0.9)
MPV: 8.7 fL (ref 0–99.8)
POC GRANULOCYTE: 3.3 (ref 2–6.9)
POC LYMPH %: 45.3 % (ref 10–50)
POC MID %: 5.2 %M (ref 0–12)
Platelet Count, POC: 196 10*3/uL (ref 142–424)
RBC: 4.67 M/uL (ref 4.04–5.48)
RDW, POC: 17.1 %
WBC: 6.6 10*3/uL (ref 4.6–10.2)

## 2016-02-04 LAB — LIPID PANEL
CHOL/HDL RATIO: 4.3 ratio (ref <5.0)
CHOLESTEROL: 162 mg/dL (ref <200)
HDL: 38 mg/dL — AB (ref >50)
LDL Cholesterol: 96 mg/dL (ref <100)
Triglycerides: 142 mg/dL (ref <150)
VLDL: 28 mg/dL (ref <30)

## 2016-02-04 LAB — POCT SEDIMENTATION RATE: POCT SED RATE: 40 mm/hr — AB (ref 0–22)

## 2016-02-04 LAB — URIC ACID: Uric Acid, Serum: 5.6 mg/dL (ref 2.5–7.0)

## 2016-02-04 MED ORDER — INDOMETHACIN 50 MG PO CAPS: 50.0000 mg | ORAL_CAPSULE | Freq: Three times a day (TID) | ORAL | 1 refills | Status: DC

## 2016-02-04 MED ORDER — PRAVASTATIN SODIUM 40 MG PO TABS: 40.0000 mg | ORAL_TABLET | Freq: Every day | ORAL | 1 refills | Status: AC

## 2016-02-04 MED ORDER — TRAMADOL HCL 50 MG PO TABS: 50.0000 mg | ORAL_TABLET | Freq: Three times a day (TID) | ORAL | 0 refills | Status: AC | PRN

## 2016-02-04 MED ORDER — LOSARTAN POTASSIUM-HCTZ 100-12.5 MG PO TABS: 1.0000 | ORAL_TABLET | Freq: Every day | ORAL | 1 refills | Status: AC

## 2016-02-04 NOTE — Progress Notes (Addendum)
Subjective:  By signing my name below, I, Brandy Hobbs, attest that this documentation has been prepared under the direction and in the presence of Brandy SorensonEva Ransom Nickson, MD Electronically Signed: Charline BillsEssence Hobbs, ED Scribe 02/04/2016 at 1:51 PM.   Patient ID: Brandy Hobbs A Bhargava, female    DOB: 12/29/1970, 45 y.o.   MRN: [id number]  Chief Complaint  Patient presents with  . Knee Pain    Left knee, x 4 days   . Immunizations    Flu   HPI HPI Comments: Brandy HuskChandra A Hobbs is a 45 y.o. female who presents to the [facility] complaining of gradually worsening right knee pain onset 4 days ago. Pt states that knee pain was the most severe last night. 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 She reports increased pain with ambulating and movement. Pt reports a cramping sensation in the posterior aspect of her right knee and a constant pain in the front of her knee. She also noticed warmth to her knee a few days ago. She has tried 2 Aleve tablets 3 days ago without relief. Pt denies joint swelling or redness. She also denies changes in BP medications. No h/o gout or arthritis.   Pt is also here to establish care and would like to refill all of here chronic medications.   Past Medical History:  Diagnosis Date  . Anemia   . Diabetes mellitus without complication (HCC)    borderline, no med  . Hypertension   . Obesity   . Sleep apnea    Does not use CPAP  . SVD (spontaneous vaginal delivery)    x 2   Current Outpatient Prescriptions on File Prior to Visit  Medication Sig Dispense Refill  . hydrochlorothiazide (HYDRODIURIL) 25 MG tablet Take 0.5 tablets (12.5 mg total) by mouth daily. 15 tablet 5  . losartan-hydrochlorothiazide (HYZAAR) 100-12.5 MG per tablet Take 1 tablet by mouth daily.    Brandy Hobbs. ibuprofen (ADVIL,MOTRIN) 600 MG tablet Take 1 tablet (600 mg total) by mouth every 6 (six) hours as needed. (Patient not  taking: Reported on 02/04/2016) 30 tablet 0  . lisinopril (PRINIVIL,ZESTRIL) 10 MG tablet Take 1 tablet (10 mg total) by mouth daily. (Patient not taking: Reported on 02/04/2016) 30 tablet 1  . pravastatin (PRAVACHOL) 40 MG tablet Take 40 mg by mouth daily.     No current facility-administered medications on file prior to visit.    No Known Allergies  Review of Systems  Constitutional: Positive for activity change. Negative for appetite change, fever and unexpected weight change.  Cardiovascular: Negative for leg swelling.  Gastrointestinal: Negative for abdominal pain.  Musculoskeletal: Positive for arthralgias, gait problem and myalgias. Negative for joint swelling.  Skin: Negative for color change, rash and wound.  Allergic/Immunologic: Negative for immunocompromised state.  Neurological: Positive for weakness. Negative for numbness.  Psychiatric/Behavioral: Positive for sleep disturbance.   BP (!) 144/96   Pulse (!) 120   Temp 98.6 F (37 C) (Oral)   Resp 16   Ht 5\' 10"  (1.778 m)   Wt (!) 395 lb (179.2 kg)   SpO2 98%   BMI 56.68 kg/m     Objective:   Physical Exam  Constitutional: She is oriented to person, place, and time. She appears well-developed and well-nourished. No distress.  HENT:  Head: Normocephalic and atraumatic.  Eyes: Conjunctivae and EOM are normal.  Neck: Neck supple. No tracheal deviation  present.  Cardiovascular: Normal rate.   Pulmonary/Chest: Effort normal. No respiratory distress.  Musculoskeletal: Normal range of motion.  Question of increased fullness in L popliteal fossa. Positive warmth in the L. Moderate varicosities bilaterally. No tenderness over the lateral joint line. Patellar stable and intact. Mild restriction, flextion and extension with mild crepitus but significant pain with joint movement. R has FROM but moderate crepitus.   Neurological: She is alert and oriented to person, place, and time.  Skin: Skin is warm and dry.  Psychiatric:  She has a normal mood and affect. Her behavior is normal.  Nursing note and vitals reviewed.  Results for orders placed or performed in visit on 02/04/16  POCT CBC  Result Value Ref Range   WBC 6.6 4.6 - 10.2 K/uL   Lymph, poc 3.0 0.6 - 3.4   POC LYMPH PERCENT 45.3 10 - 50 %L   MID (cbc) 0.3 0 - 0.9   POC MID % 5.2 0 - 12 %M   POC Granulocyte 3.3 2 - 6.9   Granulocyte percent 49.5 37 - 80 %G   RBC 4.67 4.04 - 5.48 M/uL   Hemoglobin 12.9 12.2 - 16.2 g/dL   HCT, POC 27.238.4 53.637.7 - 47.9 %   MCV 82.2 80 - 97 fL   MCH, POC 27.7 27 - 31.2 pg   MCHC 33.7 31.8 - 35.4 g/dL   RDW, POC 64.417.1 %   Platelet Count, POC 196 142 - 424 K/uL   MPV 8.7 0 - 99.8 fL     Dg Knee 1-2 Views Left  Result Date: 02/04/2016 CLINICAL DATA:  Left knee pain.  No known injury. EXAM: LEFT KNEE - 1-2 VIEW COMPARISON:  None. FINDINGS: No evidence of fracture, dislocation, or joint effusion. No evidence of arthropathy or other focal bone abnormality. Soft tissues are unremarkable. IMPRESSION: Normal left knee. Electronically Signed   By: Lupita RaiderJames  Green Jr, M.D.   On: 02/04/2016 14:25    Assessment & Plan:   1. Acute pain of left knee - unknown etiology. No known injury with in city with onset. Due to warmth in the could have inflammatory component such as gout. Reassuring that there is no erythema but patient's morbidly obese body habitus makes it difficult to assess for any effusion or Baker's cyst. Treating conservatively with RICE for several days is reasonable. Reck out of work 3 days, ice for 10-15 minutes every 2 hours, elevate as much as possible, start scheduled indomethacin with tramadol or Tylenol for breakthrough pain. Uric acid pending as patient certainly is at risk for gout considering it HCTZ and obesity will try to avoid steroid course due to type 2 diabetes. If labs are normal and pain continues recommend return to clinic to eval with sports med physician.  2. Need for prophylactic vaccination and inoculation  against influenza   3. Medication monitoring encounter   4. Inflammatory arthropathy   5. Essential hypertension - suspect elevated today due to pain. Continue same regimen but recheck within 6 months.   6. Hyperlipidemia, unspecified hyperlipidemia type    At the end of our visit patient informed me that she was planning to [facility]. She requests refills on her chronic medications of pravastatin and losartan-HCTZ. She was fasting today so added on lipid panel and CMP.  Recommend that she make an appointment to establish and/or for full CPE within the next 6 months.  Orders Placed This Encounter  Procedures  . DG Knee  1-2 Views Left    Standing Status:   Future    Number of Occurrences:   1    Standing Expiration Date:   02/03/2017    Order Specific Question:   Reason for Exam (SYMPTOM  OR DIAGNOSIS REQUIRED)    Answer:   acute pain, effusion, warmth throughout entire knee x 4d, nki, ? bakers cyst    Order Specific Question:   Is the patient pregnant?    Answer:   No    Order Specific Question:   Preferred imaging location?    Answer:   External  . Flu Vaccine QUAD 36+ mos IM  . Uric acid  . Comprehensive metabolic panel  . Lipid panel  . POCT CBC  . POCT SEDIMENTATION RATE    Meds ordered this encounter  Medications  . indomethacin (INDOCIN) 50 MG capsule    Sig: Take 1 capsule (50 mg total) by mouth 3 (three) times daily with meals.    Dispense:  30 capsule    Refill:  1  . traMADol (ULTRAM) 50 MG tablet    Sig: Take 1 tablet (50 mg total) by mouth every 8 (eight) hours as needed.    Dispense:  30 tablet    Refill:  0    I personally performed the services described in this documentation, which was scribed in my presence. The recorded information has been reviewed and considered, and addended by me as needed.   Brandy Sorenson, M.D.  [facility] [address] [place], [place] [address] [phone]  phone [phone] fax  02/04/16 6:57 PM

## 2016-02-04 NOTE — Patient Instructions (Addendum)
IF you received an x-ray today, you will receive an invoice from [facility]. Please contact [facility] at [phone] with questions or concerns regarding your invoice.   IF you received labwork today, you will receive an invoice from [facility]. Please contact [facility] at [phone] with questions or concerns regarding your invoice.   Our billing staff will not be able to assist you with questions regarding bills from these companies.  You will be contacted with the lab results as soon as they are available. The fastest way to get your results is to activate your My Chart account. Instructions are located on the last page of this paperwork. If you have not heard from us regarding the results in 2 weeks, please contact this office.   Joint Pain Joint pain, which is also called arthralgia, can be caused by many things. Joint pain often goes away when you follow your health care provider's instructions for relieving pain at home. However, joint pain can also be caused by conditions that require further treatment. Common causes of joint pain include:  Bruising in the area of the joint.  Overuse of the joint.  Wear and tear on the joints that occur with aging (osteoarthritis).  Various other forms of arthritis.  A buildup of a crystal form of uric acid in the joint (gout).  Infections of the joint (septic arthritis) or of the bone (osteomyelitis). Your health care provider may recommend medicine to help with the pain. If your joint pain continues, additional tests may be needed to diagnose your condition. Follow these instructions at home: Watch your condition for any changes. Follow these instructions as directed to lessen the pain that you are feeling.  Take medicines only as directed by your health care provider.  Rest the affected area for as long as your health care provider says that you should. If directed to do so, raise the  painful joint above the level of your heart while you are sitting or lying down.  Do not do things that cause or worsen pain.  If directed, apply ice to the painful area:  Put ice in a plastic bag.  Place a towel between your skin and the bag.  Leave the ice on for 20 minutes, 2-3 times per day.  Wear an elastic bandage, splint, or sling as directed by your health care provider. Loosen the elastic bandage or splint if your fingers or toes become numb and tingle, or if they turn cold and blue.  Begin exercising or stretching the affected area as directed by your health care provider. Ask your health care provider what types of exercise are safe for you.  Keep all follow-up visits as directed by your health care provider. This is important. Contact a health care provider if:  Your pain increases, and medicine does not help.  Your joint pain does not improve within 3 days.  You have increased bruising or swelling.  You have a fever.  You lose 10 lb (4.5 kg) or more without trying. Get help right away if:  You are not able to move the joint.  Your fingers or toes become numb or they turn cold and blue. This information is not intended to replace advice given to you by your health care provider. Make sure you discuss any questions you have with your health care provider. Document Released: 02/26/2005 Document Revised: 07/29/2015 Document Reviewed: 12/08/2013 Elsevier Interactive Patient Education  2017 [employer]   What You Need  to Know About Osteoarthritis Osteoarthritis is a type of arthritis that affects tissue that covers the ends of bones in joints (cartilage). Cartilage acts as a cushion between the bones and helps them move smoothly. Osteoarthritis results when cartilage in the joints gets worn down. Osteoarthritis is sometimes called "wear and tear" arthritis. Osteoarthritis can affect any joint and can make movement painful. Hips, knees, fingers, and toes are some of  the joints that are most often affected by osteoarthritis. You may be more likely to develop osteoarthritis if:  You are middle-aged or older.  You are obese.  You have injured a joint or had surgery on a joint.  You have a family history of osteoarthritis. How can osteoarthritis affect me? Osteoarthritis can cause:  Pain and swelling in your joint.  Difficulty moving your joint.  A grating or scraping feeling inside the joint when you move it.  Popping or creaking sounds when you move. This condition can make it harder to do things that you need or want to do each day. Osteoarthritis in a major joint, such as your knee or hip, can make it painful to walk or exercise. If you have osteoarthritis in your hands, you might not be able to grip items, twist your hand, or control small movements of your hands and fingers (fine motor skills). Over time, osteoarthritis could cause you to be less physically active. Being less active increases your risk for other long-term (chronic) health problems, such as diabetes and heart disease. What lifestyle changes can be made? You can lessen the impact that osteoarthritis has on your daily life by:  Switching to low-impact activities that do not put repeated pressure on your joints. For example, if you usually run or jog for exercise, try swimming or riding a bike instead.  Staying active. Build up to at least 150 minutes of physical activity each week to keep your joints healthy and keep your body strong.  Losing weight. If you are overweight or obese, losing weight can take pressure off of your joints. If you need help with weight loss, talk with your health care provider or a diet and nutrition specialist (dietitian). What other changes can be made? You can also lessen the effect of osteoarthritis by:  Using assistive devices. Sometimes a brace, wrap, splint, specialized glove, or cane can help. Talk with your health care provider or physical  therapist about when and how to use these.  Working with a physical therapist who can help you find ways to do your daily activities without harming your joints. A physical therapist can also teach you exercises and stretches to strengthen the muscles that support your joints.  Treating pain and inflammation. Take over-the-counter and prescription medicines for pain and inflammation only as told by your health care provider. If directed, you may put ice on the affected joint:  If you have a removable assistive device, remove it as told by your health care provider.  Put ice in a plastic bag.  Place a towel between your skin and the bag.  Leave the ice on for 20 minutes, 2-3 times a day. If other measures do not work, you may need joint surgery, such as joint replacement. What can happen if changes are not made? Osteoarthritis is a condition that gets worse over time (a progressive condition). If you do not take steps to strengthen your body and to slow down the progress of the disease, your condition may get worse more quickly. Your joints may stiffen and  become swollen, which will make them painful and hard to move. Where to find more information: You can learn more about osteoarthritis from:  The Arthritis Foundation: www.http://www.ingram.com/  [employer] of Arthritis and Musculoskeletal and Skin Diseases: www.niams.https://www.ruiz-smith.com/ Contact a health care provider if:  You cannot do your normal activities comfortably.  Your joint does not function at all.  Your pain is interfering with your sleep.  You are gaining weight.  Your joint appears to be changing in shape, instead of just being swollen and sore. Summary  Osteoarthritis is a painful joint disease that gets worse over time.  This condition can lead to other long-term (chronic) health problems.  There are changes that you can make to slow down  the progression of the disease. This information is not intended to replace advice given to you by your health care provider. Make sure you discuss any questions you have with your health care provider. Document Released: 10/18/2015 Document Revised: 10/20/2015 Document Reviewed: 10/18/2015 Elsevier Interactive Patient Education  2017 [employer].

## 2019-02-16 ENCOUNTER — Other Ambulatory Visit: Payer: Self-pay

## 2019-02-16 DIAGNOSIS — Z20822 Contact with and (suspected) exposure to covid-19: Secondary | ICD-10-CM

## 2019-02-18 ENCOUNTER — Telehealth: Payer: Self-pay | Admitting: Family Medicine

## 2019-02-18 LAB — NOVEL CORONAVIRUS, NAA: SARS-CoV-2, NAA: NOT DETECTED

## 2019-02-18 NOTE — Telephone Encounter (Signed)
Patient called in and received her negative covid test result  

## 2019-02-25 ENCOUNTER — Emergency Department (HOSPITAL_COMMUNITY): Payer: BC Managed Care – PPO

## 2019-02-25 ENCOUNTER — Emergency Department (HOSPITAL_COMMUNITY)
Admission: EM | Admit: 2019-02-25 | Discharge: 2019-02-25 | Disposition: A | Discharge status: Home / Self Care | Payer: BC Managed Care – PPO | Attending: Emergency Medicine | Admitting: Emergency Medicine

## 2019-02-25 ENCOUNTER — Encounter (HOSPITAL_COMMUNITY): Payer: Self-pay | Admitting: Emergency Medicine

## 2019-02-25 DIAGNOSIS — Z79899 Other long term (current) drug therapy: Secondary | ICD-10-CM | POA: Insufficient documentation

## 2019-02-25 DIAGNOSIS — I10 Essential (primary) hypertension: Secondary | ICD-10-CM | POA: Insufficient documentation

## 2019-02-25 DIAGNOSIS — F17210 Nicotine dependence, cigarettes, uncomplicated: Secondary | ICD-10-CM | POA: Diagnosis not present

## 2019-02-25 DIAGNOSIS — R519 Headache, unspecified: Secondary | ICD-10-CM | POA: Diagnosis present

## 2019-02-25 DIAGNOSIS — E119 Type 2 diabetes mellitus without complications: Secondary | ICD-10-CM | POA: Insufficient documentation

## 2019-02-25 LAB — CBC
HCT: 38.9 % (ref 36.0–46.0)
Hemoglobin: 12.1 g/dL (ref 12.0–15.0)
MCH: 26.2 pg (ref 26.0–34.0)
MCHC: 31.1 g/dL (ref 30.0–36.0)
MCV: 84.2 fL (ref 80.0–100.0)
Platelets: 259 10*3/uL (ref 150–400)
RBC: 4.62 MIL/uL (ref 3.87–5.11)
RDW: 18 % — ABNORMAL HIGH (ref 11.5–15.5)
WBC: 9.7 10*3/uL (ref 4.0–10.5)
nRBC: 0 % (ref 0.0–0.2)

## 2019-02-25 LAB — COMPREHENSIVE METABOLIC PANEL
ALT: 12 U/L (ref 0–44)
AST: 14 U/L — ABNORMAL LOW (ref 15–41)
Albumin: 3.2 g/dL — ABNORMAL LOW (ref 3.5–5.0)
Alkaline Phosphatase: 73 U/L (ref 38–126)
Anion gap: 13 (ref 5–15)
BUN: 10 mg/dL (ref 6–20)
CO2: 25 mmol/L (ref 22–32)
Calcium: 9.1 mg/dL (ref 8.9–10.3)
Chloride: 103 mmol/L (ref 98–111)
Creatinine, Ser: 0.95 mg/dL (ref 0.44–1.00)
GFR calc Af Amer: >60 mL/min (ref >60)
GFR calc non Af Amer: >60 mL/min (ref >60)
Glucose, Bld: 174 mg/dL — ABNORMAL HIGH (ref 70–99)
Potassium: 3.3 mmol/L — ABNORMAL LOW (ref 3.5–5.1)
Sodium: 141 mmol/L (ref 135–145)
Total Bilirubin: 0.3 mg/dL (ref 0.3–1.2)
Total Protein: 6.8 g/dL (ref 6.5–8.1)

## 2019-02-25 LAB — CBG MONITORING, ED: Glucose-Capillary: 189 mg/dL — ABNORMAL HIGH (ref 70–99)

## 2019-02-25 NOTE — ED Provider Notes (Signed)
[facility] EMERGENCY DEPARTMENT Provider Note   CSN: [id number] Arrival date & time: 02/25/19  1312     History Chief Complaint  Patient presents with  . Migraine    Brandy Hobbs is a 48 y.o. female.  She has a history of diabetes hypertension.  She said she had about 3 weeks of intermittent brief stabbing pain in her right temple behind her right eye across her right cheek.  Last for a few seconds at a time.  She said she experienced it again today but this time it stayed for about 30 minutes.  She said her blurry vision has been blurry on and off.  No double vision.  No numbness or weakness.  No fevers or chills.  She said she has had some problems with her teeth but she is not sure that that is the cause of her symptoms.  The history is provided by the patient.  Headache Pain location:  R temporal Quality:  Sharp and stabbing Radiates to:  Does not radiate Severity currently:  0/10 Severity at highest:  8/10 Onset quality:  Sudden Progression:  Resolved Chronicity:  New Relieved by:  None tried Worsened by:  Nothing Ineffective treatments:  None tried Associated symptoms: blurred vision and facial pain   Associated symptoms: no abdominal pain, no cough, no diarrhea, no ear pain, no fever, no focal weakness, no hearing loss, no loss of balance, no myalgias, no nausea, no numbness, no photophobia, no sore throat, no vomiting and no weakness        Past Medical History:  Diagnosis Date  . Anemia   . Diabetes mellitus without complication (HCC)    borderline, no med  . Hypertension   . Obesity   . Sleep apnea    Does not use CPAP  . SVD (spontaneous vaginal delivery)    x 2    Patient Active Problem List   Diagnosis Date Noted  . Hypertension 02/04/2016    Past Surgical History:  Procedure Laterality Date  . CESAREAN SECTION  2001   x 1 [facility]  . DILITATION & CURRETTAGE/HYSTROSCOPY WITH NOVASURE ABLATION N/A 02/17/2014   Procedure:  DILATATION & CURETTAGE/HYSTEROSCOPY WITH NOVASURE ABLATION;  Surgeon: Sharon Seller, DO;  Location: [facility];  Service: Gynecology;  Laterality: N/A;  . MULTIPLE TOOTH EXTRACTIONS    . TUBAL LIGATION       OB History   No obstetric history on file.     Family History  Problem Relation Age of Onset  . Pneumonia Mother   . Diabetes Father     Social History   Tobacco Use  . Smoking status: Current Every Day Smoker    Packs/day: 0.25    Years: 22.00    Pack years: 5.50    Types: Cigarettes  . Smokeless tobacco: Never Used  Substance Use Topics  . Alcohol use: No  . Drug use: No    Home Medications Prior to Admission medications   Medication Sig Start Date End Date Taking? Authorizing Provider  indomethacin (INDOCIN) 50 MG capsule Take 1 capsule (50 mg total) by mouth 3 (three) times daily with meals. 02/04/16   Sherren Mocha, MD  losartan-hydrochlorothiazide (HYZAAR) 100-12.5 MG tablet Take 1 tablet by mouth daily. 02/04/16   Sherren Mocha, MD  pravastatin (PRAVACHOL) 40 MG tablet Take 1 tablet (40 mg total) by mouth daily. 02/04/16   Sherren Mocha, MD  traMADol (ULTRAM) 50 MG tablet Take 1 tablet (50 mg total)  by mouth every 8 (eight) hours as needed. 02/04/16   Sherren MochaShaw, Eva N, MD    Allergies    Patient has no known allergies.  Review of Systems   Review of Systems  Constitutional: Negative for fever.  HENT: Negative for ear pain, hearing loss and sore throat.   Eyes: Positive for blurred vision. Negative for photophobia.  Respiratory: Negative for cough and shortness of breath.   Cardiovascular: Negative for chest pain.  Gastrointestinal: Negative for abdominal pain, diarrhea, nausea and vomiting.  Genitourinary: Negative for dysuria.  Musculoskeletal: Negative for myalgias.  Skin: Negative for rash.  Neurological: Positive for headaches. Negative for focal weakness, weakness, numbness and loss of balance.    Physical Exam Updated Vital Signs BP (!) 145/76   Pulse 96    SpO2 100%   Physical Exam Vitals and nursing note reviewed.  Constitutional:      General: She is not in acute distress.    Appearance: She is well-developed.  HENT:     Head: Normocephalic and atraumatic.  Eyes:     Extraocular Movements: Extraocular movements intact.     Conjunctiva/sclera: Conjunctivae normal.     Pupils: Pupils are equal, round, and reactive to light.  Cardiovascular:     Rate and Rhythm: Normal rate and regular rhythm.     Pulses: Normal pulses.     Heart sounds: No murmur.  Pulmonary:     Effort: Pulmonary effort is normal. No respiratory distress.     Breath sounds: Normal breath sounds.  Abdominal:     Palpations: Abdomen is soft.     Tenderness: There is no abdominal tenderness.  Musculoskeletal:        General: No deformity or signs of injury. Normal range of motion.     Cervical back: Neck supple.  Skin:    General: Skin is warm and dry.     Capillary Refill: Capillary refill takes less than 2 seconds.  Neurological:     General: No focal deficit present.     Mental Status: She is alert.     Sensory: No sensory deficit.     Motor: No weakness.     Gait: Gait normal.     ED Results / Procedures / Treatments   Labs (all labs ordered are listed, but only abnormal results are displayed) Labs Reviewed  CBC - Abnormal; Notable for the following components:      Result Value   RDW 18.0 (*)    All other components within normal limits  COMPREHENSIVE METABOLIC PANEL - Abnormal; Notable for the following components:   Potassium 3.3 (*)    Glucose, Bld 174 (*)    Albumin 3.2 (*)    AST 14 (*)    All other components within normal limits  CBG MONITORING, ED - Abnormal; Notable for the following components:   Glucose-Capillary 189 (*)    All other components within normal limits    EKG None  Radiology DG Chest 2 View  Result Date: 02/25/2019 CLINICAL DATA:  Headache behind RIGHT eye, diabetes mellitus, hypertension, smoker EXAM: CHEST  - 2 VIEW COMPARISON:  04/14/2016 FINDINGS: Normal heart size, mediastinal contours, and pulmonary vascularity. Minimal bibasilar atelectasis. Lungs otherwise clear. No pleural effusion or pneumothorax. Scattered endplate spur formation thoracic spine. IMPRESSION: Minimal bibasilar atelectasis. Electronically Signed   By: Ulyses SouthwardMark  Boles M.D.   On: 02/25/2019 14:24   CT Head Wo Contrast  Result Date: 02/25/2019 CLINICAL DATA:  Right-sided headache and blurred vision in the right  eye EXAM: CT HEAD WITHOUT CONTRAST TECHNIQUE: Contiguous axial images were obtained from the base of the skull through the vertex without intravenous contrast. COMPARISON:  CT head Jul 27, 2013 FINDINGS: Brain: No evidence of acute infarction, hemorrhage, hydrocephalus, extra-axial collection or mass lesion/mass effect. Vascular: No hyperdense vessel or unexpected calcification. Skull: No calvarial fracture or suspicious osseous lesion. No scalp swelling or hematoma. Sinuses/Orbits: Paranasal sinuses and mastoid air cells are predominantly clear. Included orbital structures are unremarkable. Other: None IMPRESSION: No acute intracranial abnormality. Electronically Signed   By: Lovena Le M.D.   On: 02/25/2019 14:59    Procedures Procedures (including critical care time)  Medications Ordered in ED Medications - No data to display  ED Course  I have reviewed the triage vital signs and the nursing notes.  Pertinent labs & imaging results that were available during my care of the patient were reviewed by me and considered in my medical decision making (see chart for details).  Clinical Course as of Feb 25 949  Wed Feb 25, 2019  1459 Chest x-ray and head CT interpreted by me as no acute findings.  Awaiting radiology reading.   [MB]  3171 48 year old female here with sharp stabbing temporal pain that lasted about 30 minutes and has since resolved.  Nonfocal neuro exam.  Differential includes migraine, tension headache,  temporal arteritis, neuropathic pain.  Less likely stroke or bleed.  Lab work showing mildly elevated glucose of 174 and low potassium of 3.3.  Head CT officially read as no acute.  Benign exam now.    [MB]    Clinical Course User Index [MB] Hayden Rasmussen, MD   MDM Rules/Calculators/A&P                     Workup unremarkable. Patient without any symptoms now. I have placed a referral into neurology for outpatient eval and patient is comfortable with plan. Return instructions discussed with patient.   Final Clinical Impression(s) / ED Diagnoses Final diagnoses:  Acute nonintractable headache, unspecified headache type    Rx / DC Orders ED Discharge Orders         Ordered    Ambulatory referral to Neurology    Comments: An appointment is requested in approximately: 2 weeks   02/25/19 1547           Hayden Rasmussen, MD 02/26/19 [phone]

## 2019-02-25 NOTE — Discharge Instructions (Addendum)
You were seen in the emergency department for a right-sided headache.  You had blood work chest x-ray and a CAT scan of the head that did not show any serious findings.  Your headache improved while you were here.  We are putting in a referral to you to follow-up with neurology.  Please also follow-up with your primary care doctor.  Return to the emergency department if any worsening or concerning symptoms.

## 2019-02-25 NOTE — ED Triage Notes (Signed)
Pt here from home with c/o headache to the right side behind her right eye , pt has been seen by her primary for same this past week ,

## 2019-03-04 NOTE — Progress Notes (Signed)
NO SHOW

## 2019-03-05 ENCOUNTER — Encounter: Payer: Self-pay | Admitting: Neurology

## 2019-03-05 ENCOUNTER — Other Ambulatory Visit: Payer: Self-pay

## 2019-03-05 ENCOUNTER — Telehealth (INDEPENDENT_AMBULATORY_CARE_PROVIDER_SITE_OTHER): Payer: BC Managed Care – PPO | Admitting: Neurology

## 2019-03-26 ENCOUNTER — Ambulatory Visit: Payer: BC Managed Care – PPO | Attending: Internal Medicine

## 2019-03-26 DIAGNOSIS — Z20822 Contact with and (suspected) exposure to covid-19: Secondary | ICD-10-CM

## 2019-03-27 LAB — NOVEL CORONAVIRUS, NAA: SARS-CoV-2, NAA: NOT DETECTED

## 2019-04-15 NOTE — Progress Notes (Signed)
NO SHOW

## 2019-04-17 ENCOUNTER — Telehealth (INDEPENDENT_AMBULATORY_CARE_PROVIDER_SITE_OTHER): Payer: BC Managed Care – PPO | Admitting: Neurology

## 2019-04-17 ENCOUNTER — Encounter: Payer: Self-pay | Admitting: Neurology

## 2019-04-17 ENCOUNTER — Telehealth: Payer: BC Managed Care – PPO | Admitting: Neurology

## 2019-04-17 ENCOUNTER — Other Ambulatory Visit: Payer: Self-pay

## 2019-05-04 NOTE — Progress Notes (Signed)
Virtual Visit via Video Note The purpose of this virtual visit is to provide medical care while limiting exposure to the novel coronavirus.    Consent was obtained for video visit:  Yes.   Answered questions that patient had about telehealth interaction:  Yes.   I discussed the limitations, risks, security and privacy concerns of performing an evaluation and management service by telemedicine. I also discussed with the patient that there may be a patient responsible charge related to this service. The patient expressed understanding and agreed to proceed.  Pt location: Home Physician Location: office Name of referring provider:  Sherren Mocha, MD I connected with Brandy Hobbs at patients initiation/request on 05/05/2019 at 10:30 AM EST by video enabled telemedicine application and verified that I am speaking with the correct person using two identifiers. Pt MRN:  [id number] Pt DOB:  [birth year] Video Participants:  Brandy Hobbs   History of Present Illness:  Brandy Hobbs is a 49 year old female with diabetes and hypertension who presents for headache.  History supplemented by ED note.  In November, she began experiencing severe sharp paroxysmal stabbing pain in her right temple and behind right eye to across her right cheek.  They would last several seconds.  Talking, eating and brushing her teeth would set it off.  She presented to the [facility] ED on 02/25/2019 because she had another attack but lasted 30 minutes.  No fever, speech disturbance, double vision, numbness or weakness.  CBC and CMP were unremarkable except for elevated glucose of 174 and mildly low potassium of 3.3.  CT head without contrast personally reviewed and was unremarkable.  She was subsequently started on carbamazepine 200mg  twice daily for presumed trigeminal neuralgia.  She has not had any further attacks.  Past Medical History: Past Medical History:  Diagnosis Date  . Anemia   . Diabetes mellitus  without complication (HCC)    borderline, no med  . Hypertension   . Obesity   . Sleep apnea    Does not use CPAP  . SVD (spontaneous vaginal delivery)    x 2    Medications: Outpatient Encounter Medications as of 05/05/2019  Medication Sig  . allopurinol (ZYLOPRIM) 100 MG tablet Take 100 mg by mouth daily.  . carbamazepine (TEGRETOL) 200 MG tablet Take 200 mg by mouth 2 (two) times daily.  05/07/2019 losartan-hydrochlorothiazide (HYZAAR) 100-12.5 MG tablet Take 1 tablet by mouth daily.  . metFORMIN (GLUCOPHAGE) 500 MG tablet Take 500 mg by mouth 2 (two) times daily.  . metoprolol tartrate (LOPRESSOR) 100 MG tablet Take 100 mg by mouth 2 (two) times daily.  . naproxen (NAPROSYN) 500 MG tablet Take 500 mg by mouth 2 (two) times daily.  . pravastatin (PRAVACHOL) 40 MG tablet Take 1 tablet (40 mg total) by mouth daily.  . traMADol (ULTRAM) 50 MG tablet Take 1 tablet (50 mg total) by mouth every 8 (eight) hours as needed.  . Vitamin D, Ergocalciferol, (DRISDOL) 1.25 MG (50000 UNIT) CAPS capsule Take 50,000 Units by mouth once a week.   No facility-administered encounter medications on file as of 05/05/2019.    Allergies: No Known Allergies  Family History: Family History  Problem Relation Age of Onset  . Pneumonia Mother   . Diabetes Father     Social History: Social History   Socioeconomic History  . Marital status: Single    Spouse name: Not on file  . Number of children: Not on file  . Years of  education: Not on file  . Highest education level: Not on file  Occupational History  . Not on file  Tobacco Use  . Smoking status: Current Every Day Smoker    Packs/day: 0.25    Years: 22.00    Pack years: 5.50    Types: Cigarettes  . Smokeless tobacco: Never Used  Substance and Sexual Activity  . Alcohol use: No  . Drug use: No  . Sexual activity: Yes    Birth control/protection: Surgical  Other Topics Concern  . Not on file  Social History Narrative  . Not on file    Social Determinants of Health   Financial Resource Strain:   . Difficulty of Paying Living Expenses: Not on file  Food Insecurity:   . Worried About Charity fundraiser in the Last Year: Not on file  . Ran Out of Food in the Last Year: Not on file  Transportation Needs:   . Lack of Transportation (Medical): Not on file  . Lack of Transportation (Non-Medical): Not on file  Physical Activity:   . Days of Exercise per Week: Not on file  . Minutes of Exercise per Session: Not on file  Stress:   . Feeling of Stress : Not on file  Social Connections:   . Frequency of Communication with Friends and Family: Not on file  . Frequency of Social Gatherings with Friends and Family: Not on file  . Attends Religious Services: Not on file  . Active Member of Clubs or Organizations: Not on file  . Attends Archivist Meetings: Not on file  . Marital Status: Not on file  Intimate Partner Violence:   . Fear of Current or Ex-Partner: Not on file  . Emotionally Abused: Not on file  . Physically Abused: Not on file  . Sexually Abused: Not on file    Observations/Objective:   There were no vitals taken for this visit. No acute distress.  Alert and oriented.  Speech fluent and not dysarthric.  Language intact.  Eyes orthophoric on primary gaze.  Face symmetric.  Assessment and Plan:   1.  Right sided facial pain, likely right sided trigeminal neuralgia.  1.  Carbamazepine 200mg  twice daily 2.  Will check MRI of brain and right trigeminal nerve w wo to evaluate for secondary etiology 3.  Follow up in 3 to 4 months.  Follow Up Instructions:    -I discussed the assessment and treatment plan with the patient. The patient was provided an opportunity to ask questions and all were answered. The patient agreed with the plan and demonstrated an understanding of the instructions.   The patient was advised to call back or seek an in-person evaluation if the symptoms worsen or if the condition  fails to improve as anticipated.     Dudley Major, DO

## 2019-05-05 ENCOUNTER — Telehealth (INDEPENDENT_AMBULATORY_CARE_PROVIDER_SITE_OTHER): Payer: BC Managed Care – PPO | Admitting: Neurology

## 2019-05-05 ENCOUNTER — Other Ambulatory Visit: Payer: Self-pay

## 2019-05-05 DIAGNOSIS — G500 Trigeminal neuralgia: Secondary | ICD-10-CM

## 2019-05-28 ENCOUNTER — Other Ambulatory Visit: Payer: Self-pay

## 2019-05-28 DIAGNOSIS — G500 Trigeminal neuralgia: Secondary | ICD-10-CM

## 2019-07-01 ENCOUNTER — Ambulatory Visit
Admission: RE | Admit: 2019-07-01 | Discharge: 2019-07-01 | Disposition: A | Discharge status: Home / Self Care | Payer: BC Managed Care – PPO | Source: Ambulatory Visit | Attending: Neurology | Admitting: Neurology

## 2019-07-01 ENCOUNTER — Other Ambulatory Visit: Payer: Self-pay

## 2019-07-01 DIAGNOSIS — G500 Trigeminal neuralgia: Secondary | ICD-10-CM

## 2019-07-01 MED ORDER — GADOBENATE DIMEGLUMINE 529 MG/ML IV SOLN
5.0000 mL | Freq: Once | INTRAVENOUS | Status: AC | PRN
  Administered 2019-07-01: 5 mL via INTRAVENOUS

## 2019-07-02 ENCOUNTER — Ambulatory Visit: Payer: BC Managed Care – PPO | Attending: Internal Medicine

## 2019-07-02 ENCOUNTER — Telehealth: Payer: Self-pay | Admitting: Neurology

## 2019-07-02 DIAGNOSIS — Z23 Encounter for immunization: Secondary | ICD-10-CM

## 2019-07-02 NOTE — Telephone Encounter (Signed)
I called and spoke with Brandy Hobbs about her MRI results.  No structural lesion identified as cause of trigeminal neuralgia.  There were some findings on increased intracranial pressure on imaging.  She did recently have an eye exam (Dr. Harriette Bouillon) and was told everything looked okay.  I believe that these findings are incidental and not related to her trigeminal neuralgia.  As her eye exam reveals no evidence of papilledema and she does not exhibit any clinical symptoms, I would not pursue an LP at this time.  She should continue annual eye exams and return to the eye doctor sooner if she has any vision problems.    She has not had any trigeminal nerve pain for quite some time.  I advised to continue the carbamazepine for now.  I answered all questions to the best of my ability.

## 2019-07-02 NOTE — Progress Notes (Signed)
   Covid-19 Vaccination Clinic  Name:  SERAPHINA MITCHNER    MRN: [id number] DOB: 02/09/1971  07/02/2019  Ms. Klar was observed post Covid-19 immunization for 15 minutes without incident. She was provided with Vaccine Information Sheet and instruction to access the V-Safe system.   Ms. Mosso was instructed to call 911 with any severe reactions post vaccine: Marland Kitchen Difficulty breathing  . Swelling of face and throat  . A fast heartbeat  . A bad rash all over body  . Dizziness and weakness   Immunizations Administered    Name Date Dose VIS Date Route   Pfizer COVID-19 Vaccine 07/02/2019 10:14 AM 0.3 mL 05/06/2018 Intramuscular   Manufacturer: [employer], [employer]   Lot: [id number]   NDC: [address]-0

## 2019-07-17 ENCOUNTER — Telehealth: Payer: Self-pay | Admitting: Neurology

## 2019-07-17 NOTE — Telephone Encounter (Signed)
Please advise on recommendations.

## 2019-07-17 NOTE — Telephone Encounter (Signed)
Please refer patient to Dr. Wynell Balloon at [facility] for vision problems.

## 2019-07-17 NOTE — Telephone Encounter (Signed)
Pt is having problems vision and was told to call back if she was having problems to get a referral to EYE DR  Please call

## 2019-07-17 NOTE — Telephone Encounter (Signed)
Pt advised order placed and faxed, notes etc.

## 2019-07-27 ENCOUNTER — Ambulatory Visit: Payer: BC Managed Care – PPO

## 2019-08-03 LAB — HM DIABETES EYE EXAM

## 2019-08-19 ENCOUNTER — Telehealth: Payer: Self-pay | Admitting: Neurology

## 2019-08-19 ENCOUNTER — Other Ambulatory Visit: Payer: Self-pay | Admitting: Neurology

## 2019-08-19 NOTE — Telephone Encounter (Signed)
I would like her to increase dose to 300mg  twice daily.

## 2019-08-19 NOTE — Telephone Encounter (Signed)
We can refill carbamazepine 200mg  tablet as 1.5 tablets twice daily (quantity 90, refills 0).  Just need to verify which pharmacy to send it.

## 2019-08-19 NOTE — Telephone Encounter (Signed)
Patient called in and wanted Dr. Everlena Cooper to be aware that the pain from her trigeminal neuralgia has returned. Her next appointment is scheduled for 09/04/19.

## 2019-08-19 NOTE — Telephone Encounter (Signed)
Pt states she took 3 pills of the Carbamazepine on 08/18/19. The pain subsided for a while and came back last night, But this morning pt states the pain not as server as yesterday.   Pt states if she increase her dose she may need refills not sure if she has enough to last til her appt 09/03/19

## 2019-08-19 NOTE — Telephone Encounter (Signed)
LMOVM Need to know what pharmacy to send script.

## 2019-08-20 ENCOUNTER — Other Ambulatory Visit: Payer: Self-pay

## 2019-08-20 MED ORDER — CARBAMAZEPINE 200 MG PO TABS: ORAL_TABLET | ORAL | 0 refills | Status: DC

## 2019-08-20 NOTE — Telephone Encounter (Signed)
[facility] would like prescription sent to [facility] [address].

## 2019-08-24 NOTE — Telephone Encounter (Signed)
Once she has been on the 300mg  twice daily for 7 days and pain not improved, then she may increase to 400mg  twice daily.

## 2019-08-24 NOTE — Telephone Encounter (Signed)
The following message was left with [facility] on 08/24/19 at 12:39 PM.  Caller states she is having bad pain with her neuralgia and she doesn't know what to do, current medication is not helping.   Please see previous documentation.

## 2019-08-24 NOTE — Telephone Encounter (Signed)
Pt advised of note.

## 2019-08-24 NOTE — Telephone Encounter (Signed)
Advised Pt to try and give the medication at least three weeks to see how she do. Pt has appt on 09/04/19

## 2019-09-02 NOTE — Progress Notes (Addendum)
NEUROLOGY FOLLOW UP OFFICE NOTE  Brandy Hobbs [id number]  HISTORY OF PRESENT ILLNESS: Brandy Hobbs is a 49 year old female with diabetes and hypertension who follows up for right-sided trigeminal neuralgia.  UPDATE: Current medication:  Carbamazepine 400mg /200mg /200mg ; tramadol 50mg   MRI of brain and trigeminal nerves on 07/01/2019 personally reviewed showed no secondary etiology for trigeminal neuralgia but did demonstrate constellation of symptoms identified with intracranial hypertension, including partially empty sella and increased CSF in the bilateral optic nerve root sleeves.  Despite attempts, only minimal IV contrast was administered but no obvious abnormal enhancement.  She was referred to Dr. Kathlen Mody at [facility] for formal eye exam on 08/03/2019, which demonstrated no papilledema.  Repeat OCT RNFL with visual fields was recommended in 2 to 3 months.  Due to increased pain, carbamazepine was increased to 400mg /200mg /200mg , however the pain persists frequently throughout the day.  Tramadol not effective.  HISTORY: In November 2020, she began experiencing severe sharp paroxysmal stabbing pain in her right temple and behind right eye to across her right cheek.  They would last several seconds.  Talking, eating and brushing her teeth would set it off.  For a time, she noted numbness in her mouth.She presented to the [facility] ED on 02/25/2019 because she had another attack but lasted 30 minutes.  No fever, speech disturbance, double vision, numbness or weakness.  CBC and CMP were unremarkable except for elevated glucose of 174 and mildly low potassium of 3.3.  CT head without contrast personally reviewed and was unremarkable.  She was subsequently started on carbamazepine 200mg  twice daily for presumed trigeminal neuralgia.   PAST MEDICAL HISTORY: Past Medical History:  Diagnosis Date  . Anemia   . Diabetes mellitus without complication (HCC)    borderline, no med    . Hypertension   . Obesity   . Sleep apnea    Does not use CPAP  . SVD (spontaneous vaginal delivery)    x 2    MEDICATIONS: Current Outpatient Medications on File Prior to Visit  Medication Sig Dispense Refill  . allopurinol (ZYLOPRIM) 100 MG tablet Take 100 mg by mouth daily.    . carbamazepine (TEGRETOL) 200 MG tablet Take 1.5 tablets twice daily 90 tablet 0  . losartan-hydrochlorothiazide (HYZAAR) 100-12.5 MG tablet Take 1 tablet by mouth daily. 90 tablet 1  . metFORMIN (GLUCOPHAGE) 500 MG tablet Take 500 mg by mouth 2 (two) times daily.    . metoprolol tartrate (LOPRESSOR) 100 MG tablet Take 100 mg by mouth 2 (two) times daily.    . naproxen (NAPROSYN) 500 MG tablet Take 500 mg by mouth 2 (two) times daily.    . pravastatin (PRAVACHOL) 40 MG tablet Take 1 tablet (40 mg total) by mouth daily. 90 tablet 1  . traMADol (ULTRAM) 50 MG tablet Take 1 tablet (50 mg total) by mouth every 8 (eight) hours as needed. 30 tablet 0  . Vitamin D, Ergocalciferol, (DRISDOL) 1.25 MG (50000 UNIT) CAPS capsule Take 50,000 Units by mouth once a week.     No current facility-administered medications on file prior to visit.    ALLERGIES: No Known Allergies  FAMILY HISTORY: Family History  Problem Relation Age of Onset  . Pneumonia Mother   . Diabetes Father    SOCIAL HISTORY: Social History   Socioeconomic History  . Marital status: Single    Spouse name: Not on file  . Number of children: Not on file  . Years of education: Not on file  .  Highest education level: Not on file  Occupational History  . Not on file  Tobacco Use  . Smoking status: Current Every Day Smoker    Packs/day: 0.25    Years: 22.00    Pack years: 5.50    Types: Cigarettes  . Smokeless tobacco: Never Used  Substance and Sexual Activity  . Alcohol use: No  . Drug use: No  . Sexual activity: Yes    Birth control/protection: Surgical  Other Topics Concern  . Not on file  Social History Narrative  . Not on  file   Social Determinants of Health   Financial Resource Strain:   . Difficulty of Paying Living Expenses:   Food Insecurity:   . Worried About Programme researcher, broadcasting/film/video in the Last Year:   . Barista in the Last Year:   Transportation Needs:   . Freight forwarder (Medical):   Marland Kitchen Lack of Transportation (Non-Medical):   Physical Activity:   . Days of Exercise per Week:   . Minutes of Exercise per Session:   Stress:   . Feeling of Stress :   Social Connections:   . Frequency of Communication with Friends and Family:   . Frequency of Social Gatherings with Friends and Family:   . Attends Religious Services:   . Active Member of Clubs or Organizations:   . Attends Banker Meetings:   Marland Kitchen Marital Status:   Intimate Partner Violence:   . Fear of Current or Ex-Partner:   . Emotionally Abused:   Marland Kitchen Physically Abused:   . Sexually Abused:      PHYSICAL EXAM: Blood pressure (!) 166/103, pulse 72, resp. rate 18, height 5\' 11"  (1.803 m), weight (!) 357 lb (161.9 kg), SpO2 96 %. General: No acute distress.  Patient appears well-groomed.    IMPRESSION: 1.  Right sided trigeminal neuralgia (V1-V2)  PLAN: 1.  Due to severity and persistence of pain, failing carbamazepine, we will refer her to specialist at [facility] for possible procedure. 2.  In meantime, will transition from carbmazepine to gabapentin 300mg  three times daily.  We can adjust dose thereafter if needed. 3.  Follow up in 4 months.  Total time spent with patient:  15 minutes.  [facility], DO  CC:  , PA-C

## 2019-09-04 ENCOUNTER — Telehealth: Payer: Self-pay | Admitting: Neurology

## 2019-09-04 ENCOUNTER — Other Ambulatory Visit: Payer: Self-pay

## 2019-09-04 ENCOUNTER — Encounter: Payer: Self-pay | Admitting: Neurology

## 2019-09-04 ENCOUNTER — Ambulatory Visit: Payer: BC Managed Care – PPO | Admitting: Neurology

## 2019-09-04 VITALS — BP 166/103 | HR 72 | Resp 18 | Ht 71.0 in | Wt 357.0 lb

## 2019-09-04 DIAGNOSIS — G500 Trigeminal neuralgia: Secondary | ICD-10-CM | POA: Diagnosis not present

## 2019-09-04 MED ORDER — GABAPENTIN 100 MG PO CAPS: ORAL_CAPSULE | ORAL | 0 refills | Status: DC

## 2019-09-04 NOTE — Addendum Note (Signed)
Addended byEverlena Cooper, Kynsli Haapala R on: 09/04/2019 10:06 AM   Modules accepted: Level of Service

## 2019-09-04 NOTE — Patient Instructions (Signed)
1.  Will refer you to [facility] for possible surgical procedure of trigeminal neuralgia 2.  In meantime, I will transition you from carbamazepine to gabapentin:  Gabapentin 100MG :   Week 1:  1 capsule three times daily   Week 2:  2 capsules three times daily   Week 3:  3 capsules three times daily  Carbamazepine 200mg :   Week 1:  1 tablet three times daily   Week 2:  1 tablet twice daily   Week 3:  1 tablet daily   Week 4:  STOP 3.  Follow up in 4 months.

## 2019-09-04 NOTE — Telephone Encounter (Signed)
New script sent to [facility]. Gabapentin cancelled at [facility]

## 2019-09-04 NOTE — Telephone Encounter (Signed)
Patient called in and said her gabapentin prescription wasn't at her pharmacy and noticed the pharmacy on her after visit summary was incorrect. Can the prescription be sent to the [facility] pharmacy on [address] [place]ond[place] Allegra?

## 2019-09-07 ENCOUNTER — Other Ambulatory Visit: Payer: Self-pay

## 2019-09-07 DIAGNOSIS — G500 Trigeminal neuralgia: Secondary | ICD-10-CM

## 2019-09-23 ENCOUNTER — Ambulatory Visit: Payer: BC Managed Care – PPO | Attending: Internal Medicine

## 2019-09-24 LAB — SARS-COV-2, NAA 2 DAY TAT

## 2019-09-24 LAB — NOVEL CORONAVIRUS, NAA: SARS-CoV-2, NAA: NOT DETECTED

## 2019-10-20 ENCOUNTER — Telehealth: Payer: Self-pay | Admitting: Neurology

## 2019-10-20 ENCOUNTER — Other Ambulatory Visit: Payer: Self-pay | Admitting: Neurology

## 2019-10-20 ENCOUNTER — Other Ambulatory Visit: Payer: Self-pay

## 2019-10-20 MED ORDER — GABAPENTIN 100 MG PO CAPS: ORAL_CAPSULE | ORAL | 0 refills | Status: AC

## 2019-10-20 NOTE — Telephone Encounter (Signed)
Telephone call to pt,Pt wants to have Gabapentin refilled.   Gabapentin refilled until pt next visit. 01/2020

## 2019-10-20 NOTE — Telephone Encounter (Signed)
Telephone call back to pt, pt wanted to know who her referral was for Neurosurgery  DR. Loralee Pacas at [facility] given to pt.

## 2019-10-20 NOTE — Telephone Encounter (Signed)
[facility] 10/20/19 @ 12:30pm:  "Caller states she is needing a medication refill."  Called patient to ask which medication she would like refilled and preferred pharmacy, had to LVM.

## 2019-10-20 NOTE — Telephone Encounter (Signed)
Patient called in and left a message returning a call and needing some referral information

## 2019-11-04 ENCOUNTER — Telehealth: Payer: Self-pay | Admitting: Neurology

## 2019-11-04 NOTE — Telephone Encounter (Signed)
Patient is scheduled for 11/11/19 at [facility] with [facility] Dr. Atlee Abide.

## 2020-01-25 NOTE — Progress Notes (Deleted)
NEUROLOGY FOLLOW UP OFFICE NOTE  Brandy Hobbs [id number]   Subjective:  Brandy Hobbs is a 49 year old female with diabetes and hypertension who follows up for right-sided trigeminal neuralgia.  UPDATE: Current medication:  Gabapentin 300mg  TID  Last visit, she was transitioned from carbamazepine to gabapentin.  However, the gabapentin has made her dizzy.  ***  She was referred to [facility].  She saw Dr. [facility] of neurosurgery who then referred her to Dr. Atlee Abide for [facility].    HISTORY: In November 2020, she began experiencing severe sharp paroxysmal stabbing pain in her right temple and behind right eye to across her right cheek. They would last several seconds. Talking, eating and brushing her teeth would set it off. For a time, she noted numbness in her mouth.She presented to the [facility] ED on 02/25/2019 because she had another attack but lasted 30 minutes. No fever, speech disturbance, double vision, numbness or weakness. CBC and CMP were unremarkable except for elevated glucose of 174 and mildly low potassium of 3.3. CT head without contrast personally reviewed and was unremarkable. She was subsequently started on carbamazepine 200mg  twice daily for presumed trigeminal neuralgia.  MRI of brain and trigeminal nerves on 07/01/2019 personally reviewed showed no secondary etiology for trigeminal neuralgia but did demonstrate constellation of symptoms identified with intracranial hypertension, including partially empty sella and increased CSF in the bilateral optic nerve root sleeves.  Despite attempts, only minimal IV contrast was administered but no obvious abnormal enhancement.  She was referred to Dr. at [facility] for formal eye exam on 08/03/2019, which demonstrated no papilledema.  Repeat OCT RNFL with visual fields was recommended in 2 to 3 months.  Past medications:  Tramadol, carbamazepine  PAST MEDICAL  HISTORY: Past Medical History:  Diagnosis Date  . Anemia   . Diabetes mellitus without complication (HCC)    borderline, no med  . Hypertension   . Obesity   . Sleep apnea    Does not use CPAP  . SVD (spontaneous vaginal delivery)    x 2    MEDICATIONS: Current Outpatient Medications on File Prior to Visit  Medication Sig Dispense Refill  . allopurinol (ZYLOPRIM) 100 MG tablet Take 100 mg by mouth daily.    [facility] gabapentin (NEURONTIN) 100 MG capsule Take 3 capsules TID 270 capsule 0  . losartan-hydrochlorothiazide (HYZAAR) 100-12.5 MG tablet Take 1 tablet by mouth daily. 90 tablet 1  . metFORMIN (GLUCOPHAGE) 500 MG tablet Take 500 mg by mouth 2 (two) times daily.    . metoprolol tartrate (LOPRESSOR) 100 MG tablet Take 100 mg by mouth 2 (two) times daily.    . naproxen (NAPROSYN) 500 MG tablet Take 500 mg by mouth 2 (two) times daily.    . pravastatin (PRAVACHOL) 40 MG tablet Take 1 tablet (40 mg total) by mouth daily. 90 tablet 1  . traMADol (ULTRAM) 50 MG tablet Take 1 tablet (50 mg total) by mouth every 8 (eight) hours as needed. 30 tablet 0  . Vitamin D, Ergocalciferol, (DRISDOL) 1.25 MG (50000 UNIT) CAPS capsule Take 50,000 Units by mouth once a week.     No current facility-administered medications on file prior to visit.    ALLERGIES: No Known Allergies  FAMILY HISTORY: Family History  Problem Relation Age of Onset  . Pneumonia Mother   . Diabetes Father     SOCIAL HISTORY: Social History   Socioeconomic History  . Marital status: Single  Spouse name: Not on file  . Number of children: Not on file  . Years of education: Not on file  . Highest education level: Not on file  Occupational History  . Not on file  Tobacco Use  . Smoking status: Current Every Day Smoker    Packs/day: 0.25    Years: 22.00    Pack years: 5.50    Types: Cigarettes  . Smokeless tobacco: Never Used  Substance and Sexual Activity  . Alcohol use: No  . Drug use: No  . Sexual  activity: Yes    Birth control/protection: Surgical  Other Topics Concern  . Not on file  Social History Narrative   Right handed   One story home   Drinks caffeine   Social Determinants of Health   Financial Resource Strain:   . Difficulty of Paying Living Expenses: Not on file  Food Insecurity:   . Worried About Programme researcher, broadcasting/film/video in the Last Year: Not on file  . Ran Out of Food in the Last Year: Not on file  Transportation Needs:   . Lack of Transportation (Medical): Not on file  . Lack of Transportation (Non-Medical): Not on file  Physical Activity:   . Days of Exercise per Week: Not on file  . Minutes of Exercise per Session: Not on file  Stress:   . Feeling of Stress : Not on file  Social Connections:   . Frequency of Communication with Friends and Family: Not on file  . Frequency of Social Gatherings with Friends and Family: Not on file  . Attends Religious Services: Not on file  . Active Member of Clubs or Organizations: Not on file  . Attends Banker Meetings: Not on file  . Marital Status: Not on file  Intimate Partner Violence:   . Fear of Current or Ex-Partner: Not on file  . Emotionally Abused: Not on file  . Physically Abused: Not on file  . Sexually Abused: Not on file     Objective:  *** General: No acute distress.  Patient appears well-groomed.   Head:  Normocephalic/atraumatic Eyes:  Fundi examined but not visualized Neck: supple, no paraspinal tenderness, full range of motion Heart:  Regular rate and rhythm Lungs:  Clear to auscultation bilaterally Back: No paraspinal tenderness Neurological Exam: alert and oriented to person, place, and time. Attention span and concentration intact, recent and remote memory intact, fund of knowledge intact.  Speech fluent and not dysarthric, language intact.  CN II-XII intact. Bulk and tone normal, muscle strength 5/5 throughout.  Sensation to light touch, temperature and vibration intact.  Deep tendon  reflexes 2+ throughout, toes downgoing.  Finger to nose and heel to shin testing intact.  Gait normal, Romberg negative.   Assessment/Plan:   1.  Right-sided trigeminal neuralgia ***  Shon Millet, DO  CC: Roxanne Mins, PA-C

## 2020-01-26 ENCOUNTER — Encounter: Payer: Self-pay | Admitting: Neurology

## 2020-01-26 ENCOUNTER — Ambulatory Visit: Payer: BC Managed Care – PPO | Admitting: Neurology

## 2020-01-26 DIAGNOSIS — Z029 Encounter for administrative examinations, unspecified: Secondary | ICD-10-CM

## 2020-01-27 ENCOUNTER — Encounter: Payer: Self-pay | Admitting: Neurology

## 2020-01-29 ENCOUNTER — Telehealth: Payer: Self-pay | Admitting: Neurology

## 2020-01-29 NOTE — Telephone Encounter (Signed)
Patient dismissed from [facility] by Shon Millet, DO, effective 01/26/20. Dismissal Letter sent out by 1st class mail. [employer]

## 2020-12-13 IMAGING — CT CT HEAD W/O CM
4 series · 17 of 47 positions shown, 19 images · non-contrast
Comparison: CT head July 27, 2013

CLINICAL DATA: Right-sided headache and blurred vision in the right
eye

EXAM:
CT HEAD WITHOUT CONTRAST
TECHNIQUE: Contiguous axial images were obtained from the base of the skull
through the vertex without intravenous contrast.

[Series 3: head without · axial · non-contrast · 0.47mm/px · z∈[-126,-1]mm · 7 of 35 slices shown, 9 images]
[im 5/35  brain]
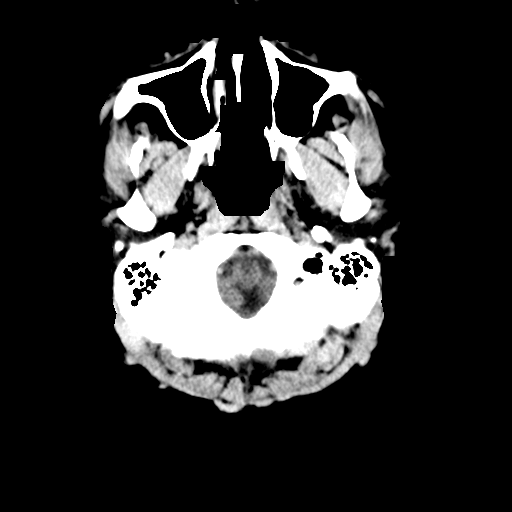
[im 5/35  bone]
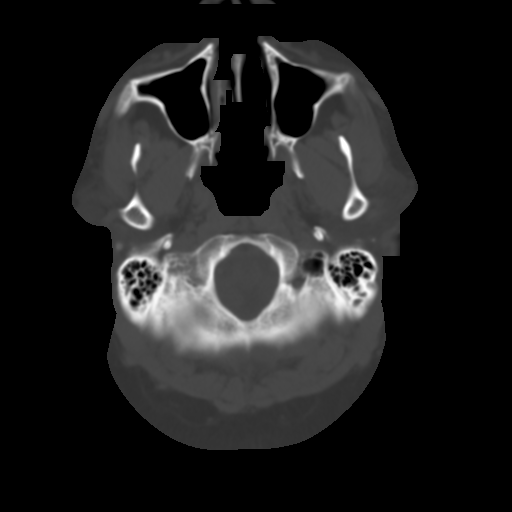
[im 9/35  brain]
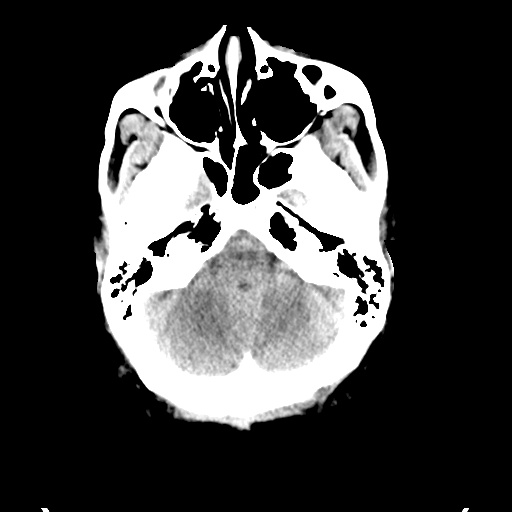
[im 13/35  brain]
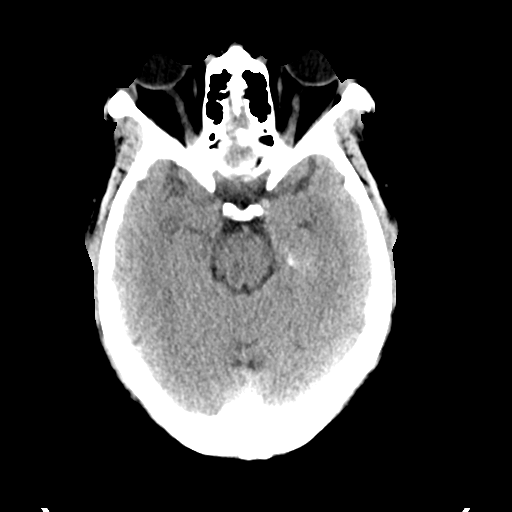
[im 18/35  brain]
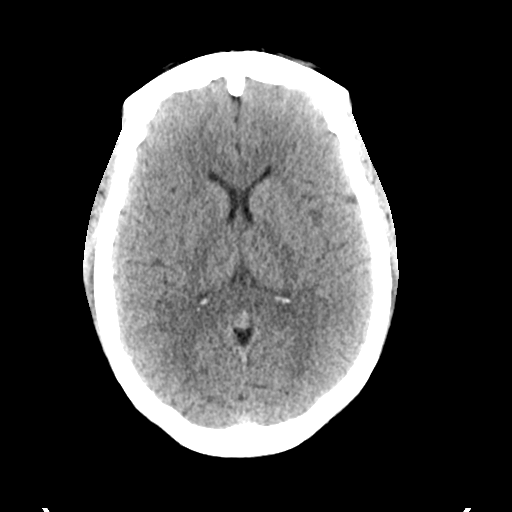
[im 22/35  brain]
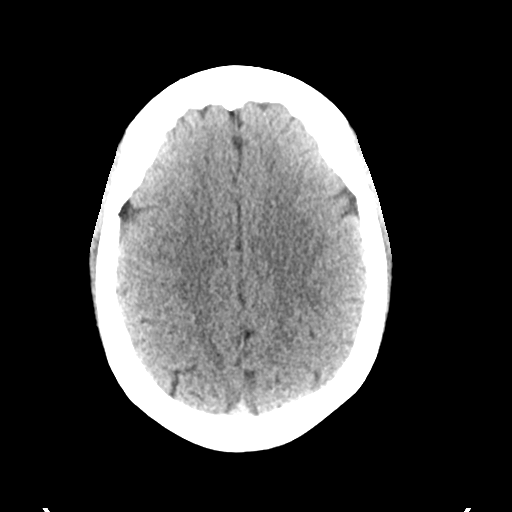
[im 22/35  bone]
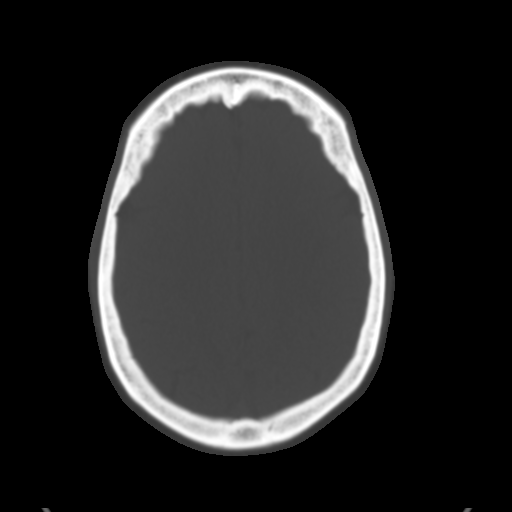
[im 26/35  brain]
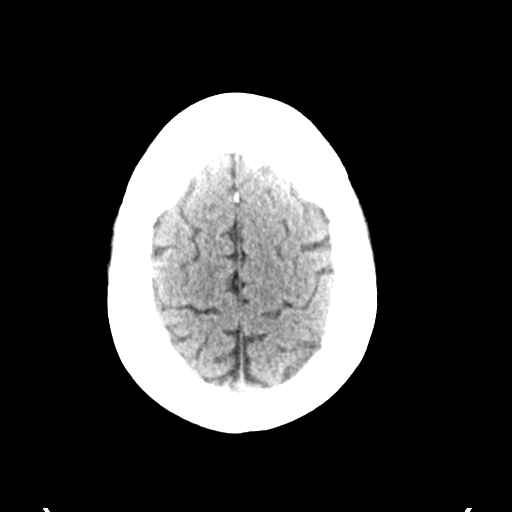
[im 30/35  brain]
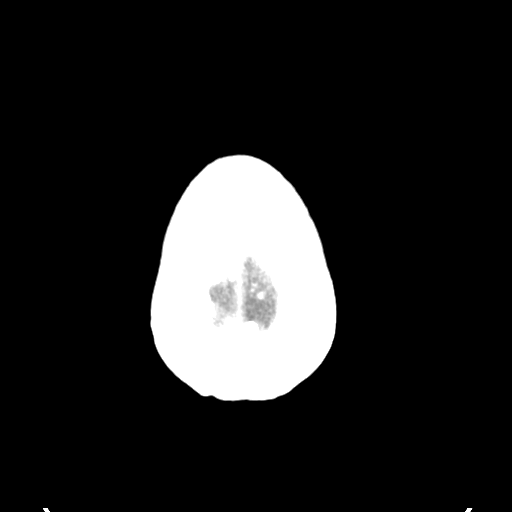

[Series 4: head bone · axial · 0.47mm/px · z∈[-130,-70]mm · 4 of 87 slices shown]
[im 9/87  bone]
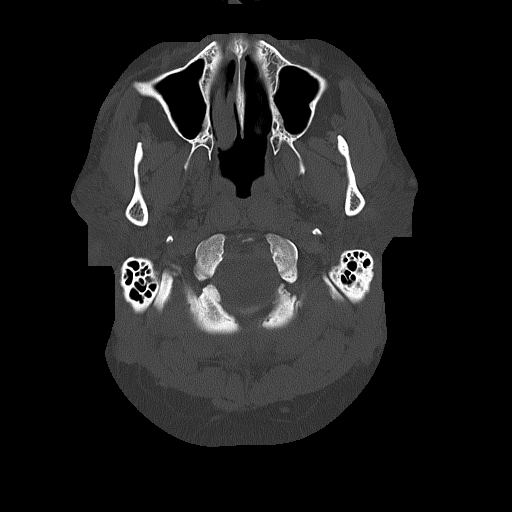
[im 18/87  bone]
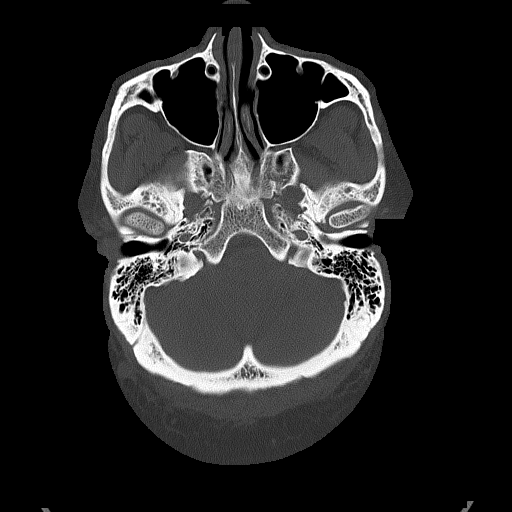
[im 26/87  bone]
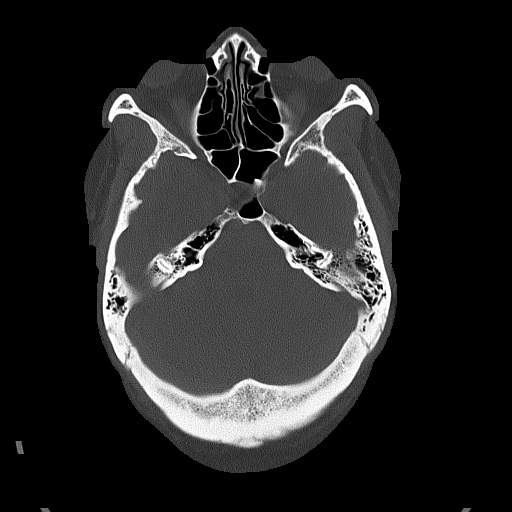
[im 39/87  bone]
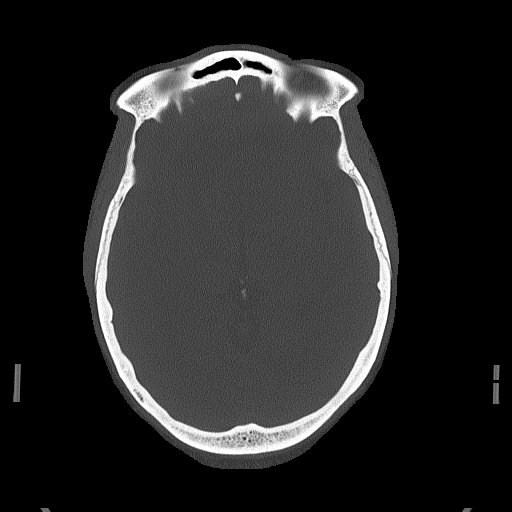

[Series 5: head without cor · coronal · non-contrast · 0.34mm/px · 3 of 69 slices shown]
[im 23/69  brain]
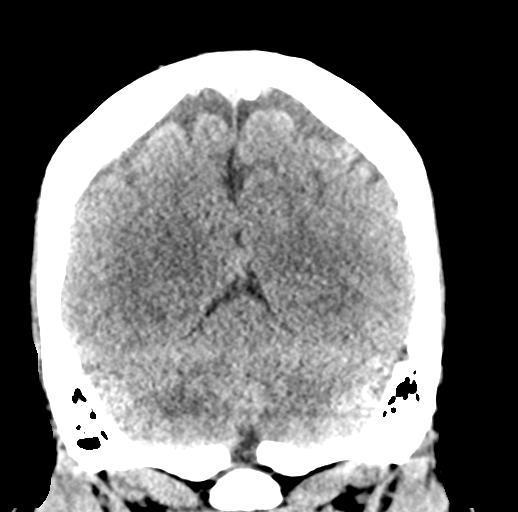
[im 31/69  brain]
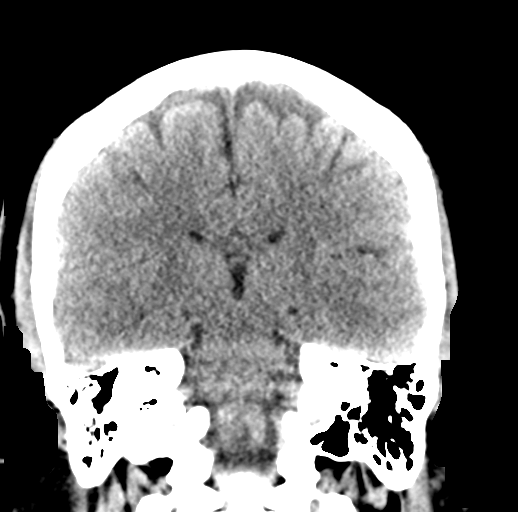
[im 38/69  brain]
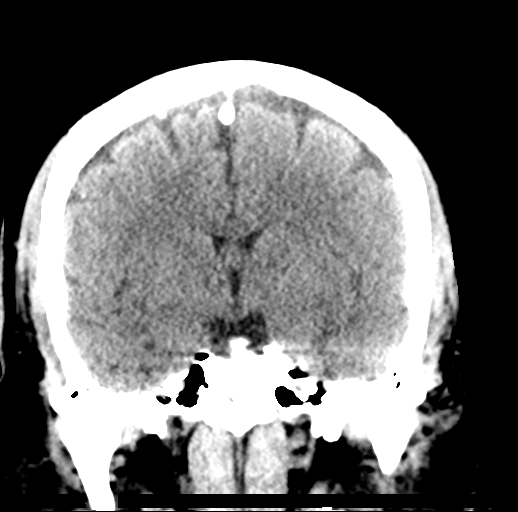

[Series 6: head without sag · sagittal · non-contrast · 0.34mm/px · 3 of 67 slices shown]
[im 23/67  brain]
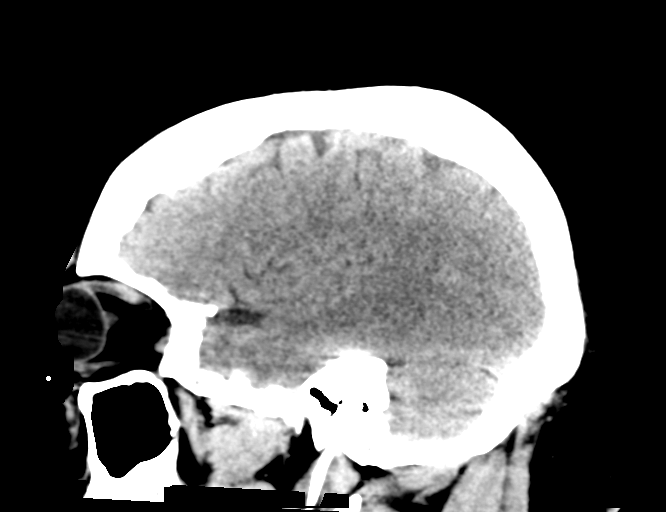
[im 34/67  brain]
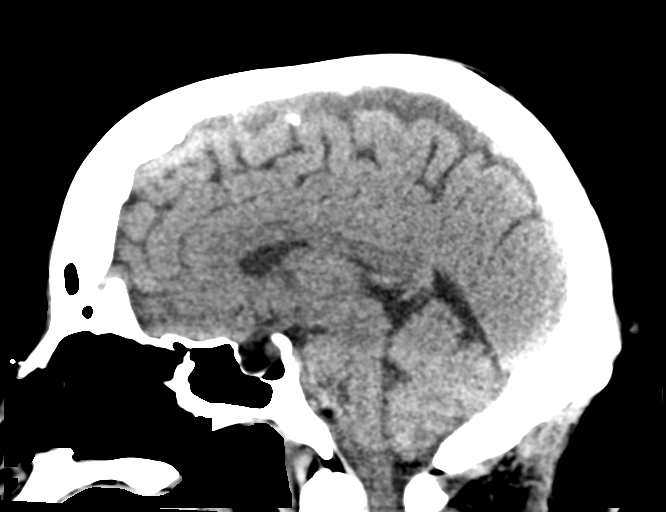
[im 45/67  brain]
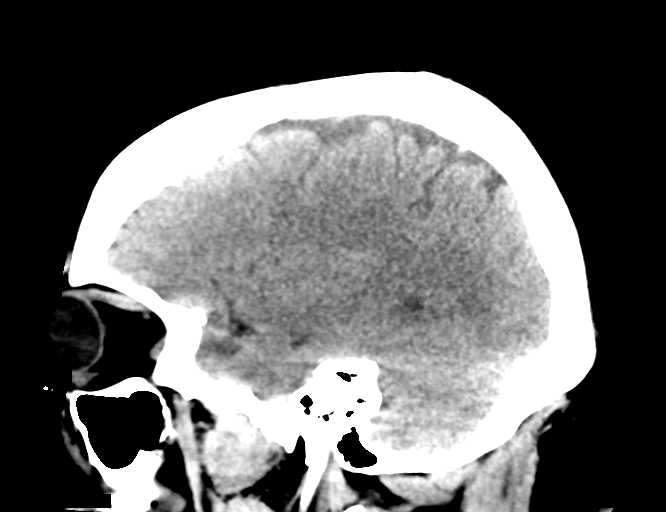

[17 of 47 positions shown; findings below may reference images not displayed]

FINDINGS: Brain: No evidence of acute infarction, hemorrhage, hydrocephalus,
extra-axial collection or mass lesion/mass effect.

Vascular: No hyperdense vessel or unexpected calcification.

Skull: No calvarial fracture or suspicious osseous lesion. No scalp
swelling or hematoma.

Sinuses/Orbits: Paranasal sinuses and mastoid air cells are
predominantly clear. Included orbital structures are unremarkable.

Other: None
IMPRESSION: No acute intracranial abnormality.
